# Patient Record
Sex: Male | Born: 1985 | Race: Black or African American | Hispanic: No | Marital: Married | Smoking: Never smoker
Health system: Southern US, Community
[De-identification: ages and names within clinical notes are randomized; demographics above are authoritative.]

## PROBLEM LIST (undated history)

## (undated) DIAGNOSIS — H33311 Horseshoe tear of retina without detachment, right eye: Secondary | ICD-10-CM

## (undated) DIAGNOSIS — G4730 Sleep apnea, unspecified: Secondary | ICD-10-CM

## (undated) DIAGNOSIS — J40 Bronchitis, not specified as acute or chronic: Secondary | ICD-10-CM

## (undated) HISTORY — PX: ADENOIDECTOMY: SUR15

---
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
## 2007-10-24 ENCOUNTER — Emergency Department (HOSPITAL_COMMUNITY): Admission: EM | Admit: 2007-10-24 | Discharge: 2007-10-24 | Payer: Self-pay | Admitting: Emergency Medicine

## 2008-01-26 ENCOUNTER — Emergency Department (HOSPITAL_COMMUNITY): Admission: EM | Admit: 2008-01-26 | Discharge: 2008-01-26 | Payer: Self-pay | Admitting: Emergency Medicine

## 2008-12-16 ENCOUNTER — Emergency Department (HOSPITAL_COMMUNITY): Admission: EM | Admit: 2008-12-16 | Discharge: 2008-12-16 | Payer: Self-pay | Admitting: Emergency Medicine

## 2010-03-23 ENCOUNTER — Encounter (INDEPENDENT_AMBULATORY_CARE_PROVIDER_SITE_OTHER): Payer: Self-pay | Admitting: *Deleted

## 2010-03-23 ENCOUNTER — Emergency Department (HOSPITAL_COMMUNITY): Admission: EM | Admit: 2010-03-23 | Discharge: 2010-03-23 | Payer: Self-pay | Admitting: Family Medicine

## 2010-04-07 ENCOUNTER — Telehealth: Payer: Self-pay | Admitting: Internal Medicine

## 2010-07-06 NOTE — Progress Notes (Signed)
Summary: nos appt  Phone Note Call from Patient   Caller: [email or web address]  Call For: wert Summary of Call: LMTCB x2 to rsc nos from 11/1. Initial call taken by: Darletta Moll,  April 07, 2010 2:59 PM

## 2010-07-06 NOTE — Letter (Signed)
Summary: New Patient Letter  [facility] [place]  [address] [place], [place] [address]   Phone: [phone]  Fax: [phone]       03/23/2010 MRN: [id number]  Oscar Morris [address] [place], [place]  [address]  Dear Mr. Oscar Morris,  Welcome to [facility] and thank you for choosing [place] as your Primary Care Providers. Enclosed you will find information about our practice that we hope you find helpful. We have also enclosed forms to be filled out prior to your visit. This will provide [place] with the necessary information and facilitate your being seen in a timely manner. If you have any questions, please call us at: [phone] and we will be happy to assist you. We look forward to seeing you at your scheduled appointment time.  Appointment Friday, April 23, 2010 at 3:30pm  with Dr. Marga Melnick   Sincerely,  Primary Health Care Team  Please arrive 15 minutes early for your first appointment and bring your insurance card. Co-pay is required at the time of your visit.  *****Please call the office if you are not able to keep this appointment. There is a charge of $50.00 if any appointment is not cancelled or rescheduled within 24 hours*****

## 2010-10-22 NOTE — Op Note (Signed)
   NAME:  Oscar Morris, Oscar Morris                        ACCOUNT NO.:  [id number]   MEDICAL RECORD NO.:  [id number]                   PATIENT TYPE:  AMB   LOCATION:  [facility]                                  FACILITY:  [facility]   PHYSICIAN:  Kristine Garbe. Ezzard Standing, M.D.         DATE OF BIRTH:  July 01, 1985   DATE OF PROCEDURE:  06/07/2002  DATE OF DISCHARGE:                                 OPERATIVE REPORT   PREOPERATIVE DIAGNOSIS:  Adenoid hypertrophy with obstructive breathing  pattern.   POSTOPERATIVE DIAGNOSIS:  Adenoid hypertrophy with obstructive breathing  pattern.   OPERATION PERFORMED:  Adenoidectomy.   SURGEON:  Kristine Garbe. Ezzard Standing, M.D.   ANESTHESIA:  General endotracheal.   COMPLICATIONS:  None.   INDICATIONS FOR PROCEDURE:  The patient is a 25 year old who has chronic  nasal obstruction, breathes heavily at night with heavy snoring.  On  examination, he has large adenoid tissue with moderate sized 2+ tonsils.  He  is taken to the operating room at this time for adenoidectomy.   DESCRIPTION OF PROCEDURE:  After adequate endotracheal anesthesia, a mouth  gag was used to expose the oropharynx.  A red rubber catheter was passed  through the nose and out the mouth to retract the soft palate and the  nasopharynx was examined.  The patient had large obstructing adenoid tissue,  a large adenoid curet was used to remove the central pad of adenoid tissue,  nasopharyngeal packs were placed for hemostasis.  These were then removed  and further hemostasis was obtained with suction cautery.  After obtaining  adequate hemostasis, the nose and nasopharynx was irrigated with saline.  This completed the procedure.  The patient was awakened from anesthesia and  transferred to recovery postoperatively doing well.   DISPOSITION:  The patient is discharge to home later this morning on Tylenol  for pain and amoxicillin 500 mg b.i.d. for four days.      Kristine Garbe. Ezzard Standing, M.D.    CEN/MEDQ  D:  06/07/2002  T:  06/07/2002  Job:  [id number]

## 2011-06-09 ENCOUNTER — Encounter (HOSPITAL_COMMUNITY): Payer: Self-pay | Admitting: Emergency Medicine

## 2011-06-09 ENCOUNTER — Encounter: Payer: Self-pay | Admitting: Emergency Medicine

## 2011-06-09 ENCOUNTER — Emergency Department (HOSPITAL_COMMUNITY)
Admission: EM | Admit: 2011-06-09 | Discharge: 2011-06-10 | Disposition: A | Discharge status: Home / Self Care | Payer: BC Managed Care – PPO | Attending: Emergency Medicine | Admitting: Emergency Medicine

## 2011-06-09 ENCOUNTER — Emergency Department (HOSPITAL_COMMUNITY)
Admission: EM | Admit: 2011-06-09 | Discharge: 2011-06-09 | Disposition: A | Discharge status: Home / Self Care | Payer: BC Managed Care – PPO | Attending: Emergency Medicine | Admitting: Emergency Medicine

## 2011-06-09 ENCOUNTER — Emergency Department (HOSPITAL_COMMUNITY): Payer: BC Managed Care – PPO

## 2011-06-09 DIAGNOSIS — R0602 Shortness of breath: Secondary | ICD-10-CM | POA: Insufficient documentation

## 2011-06-09 DIAGNOSIS — F411 Generalized anxiety disorder: Secondary | ICD-10-CM | POA: Insufficient documentation

## 2011-06-09 DIAGNOSIS — J40 Bronchitis, not specified as acute or chronic: Secondary | ICD-10-CM | POA: Insufficient documentation

## 2011-06-09 DIAGNOSIS — R05 Cough: Secondary | ICD-10-CM | POA: Insufficient documentation

## 2011-06-09 DIAGNOSIS — R059 Cough, unspecified: Secondary | ICD-10-CM | POA: Insufficient documentation

## 2011-06-09 DIAGNOSIS — J45909 Unspecified asthma, uncomplicated: Secondary | ICD-10-CM | POA: Insufficient documentation

## 2011-06-09 DIAGNOSIS — Z79899 Other long term (current) drug therapy: Secondary | ICD-10-CM | POA: Insufficient documentation

## 2011-06-09 DIAGNOSIS — R079 Chest pain, unspecified: Secondary | ICD-10-CM | POA: Insufficient documentation

## 2011-06-09 DIAGNOSIS — R1110 Vomiting, unspecified: Secondary | ICD-10-CM | POA: Insufficient documentation

## 2011-06-09 DIAGNOSIS — IMO0001 Reserved for inherently not codable concepts without codable children: Secondary | ICD-10-CM | POA: Insufficient documentation

## 2011-06-09 HISTORY — DX: Bronchitis, not specified as acute or chronic: J40

## 2011-06-09 MED ORDER — PROMETHAZINE HCL 25 MG PO TABS: 25.0000 mg | ORAL_TABLET | Freq: Four times a day (QID) | ORAL | Status: AC | PRN

## 2011-06-09 MED ORDER — ALBUTEROL SULFATE HFA 108 (90 BASE) MCG/ACT IN AERS
2.0000 | INHALATION_SPRAY | Freq: Four times a day (QID) | RESPIRATORY_TRACT | Status: DC
  Administered 2011-06-09: 2 via RESPIRATORY_TRACT
  Filled 2011-06-09: qty 6.7

## 2011-06-09 NOTE — ED Notes (Signed)
Pt alert, nad, c/o cough, URI, onset several days ago, resp even unlabored, skin pwd, recent ill exposure to spouse, dry npc noted

## 2011-06-09 NOTE — ED Notes (Signed)
Patient transported to X-ray 

## 2011-06-09 NOTE — ED Notes (Signed)
PT. SEEN AT [facility] THIS MORNING DIAGNOSED WITH BRONCHITIS , GIVEN MDI FOR SOB , REPORTS PERSISTENT SOB WITH PRODUCTIVE COUGH.

## 2011-06-09 NOTE — ED Provider Notes (Addendum)
History     CSN: [id number]  Arrival date & time 06/09/11  8295   First MD Initiated Contact with Patient 06/09/11 0715      Chief Complaint  Patient presents with  . Cough  . URI    (Consider location/radiation/quality/duration/timing/severity/associated sxs/prior treatment) Patient is a 26 y.o. male presenting with cough and URI. The history is provided by the patient and the spouse.  Cough This is a new problem. The current episode started more than 1 week ago. The problem occurs constantly. The problem has not changed since onset.The cough is non-productive. There has been no fever. Associated symptoms include myalgias and shortness of breath. Pertinent negatives include no chest pain, no chills, no ear congestion, no headaches, no sore throat, no wheezing and no eye redness. He is not a smoker. His past medical history is significant for asthma. His past medical history does not include pneumonia.  URI The primary symptoms include fever, cough, vomiting and myalgias. Primary symptoms do not include headaches, sore throat, wheezing, abdominal pain, nausea or rash.  Symptoms associated with the illness include congestion. The illness is not associated with chills.   Patient now with 2 week history of upper respiratory infection cough persistent fever chills on and off occasional vomiting. Not improved. Past history of asthma.  Past Medical History  Diagnosis Date  . Asthma     History reviewed. No pertinent past surgical history.  No family history on file.  History  Substance Use Topics  . Smoking status: Not on file  . Smokeless tobacco: Not on file  . Alcohol Use: No      Review of Systems  Constitutional: Positive for fever. Negative for chills.  HENT: Positive for congestion. Negative for sore throat, neck pain and neck stiffness.   Eyes: Negative for redness.  Respiratory: Positive for cough and shortness of breath. Negative for wheezing.   Cardiovascular:  Negative for chest pain.  Gastrointestinal: Positive for vomiting. Negative for nausea, abdominal pain and diarrhea.  Genitourinary: Negative for dysuria.  Musculoskeletal: Positive for myalgias. Negative for back pain.  Skin: Negative for rash.  Neurological: Negative for headaches.  Hematological: Negative for adenopathy.    Allergies  Review of patient's allergies indicates no known allergies.  Home Medications   Current Outpatient Rx  Name Route Sig Dispense Refill  . PROMETHAZINE HCL 25 MG PO TABS Oral Take 1 tablet (25 mg total) by mouth every 6 (six) hours as needed for nausea. 12 tablet 0    BP 129/69  Pulse 90  Temp(Src) 98 F (36.7 C) (Oral)  Resp 16  Wt 195 lb (88.451 kg)  SpO2 97%  Physical Exam  Nursing note and vitals reviewed. Constitutional: He is oriented to person, place, and time. He appears well-developed and well-nourished.  HENT:  Head: Normocephalic and atraumatic.  Mouth/Throat: Oropharynx is clear and moist.  Eyes: Conjunctivae and EOM are normal. Pupils are equal, round, and reactive to light.  Neck: Normal range of motion. Neck supple.  Cardiovascular: Normal rate, regular rhythm and normal heart sounds.   No murmur heard. Pulmonary/Chest: Effort normal. He has wheezes.  Abdominal: Soft. Bowel sounds are normal. There is no tenderness.  Musculoskeletal: Normal range of motion. He exhibits no edema and no tenderness.  Lymphadenopathy:    He has no cervical adenopathy.  Neurological: He is alert and oriented to person, place, and time. No cranial nerve deficit. He exhibits normal muscle tone. Coordination normal.  Skin: Skin is warm and dry. No  rash noted. No erythema.    ED Course  Procedures (including critical care time)  Labs Reviewed - No data to display Dg Chest 2 View  06/09/2011  *RADIOLOGY REPORT*  Clinical Data: Cough and short of breath  CHEST - 2 VIEW  Comparison: 03/23/2010  Findings: Slightly shallow inspiration. The heart size  and pulmonary vascularity are normal. The lungs appear clear and expanded without focal air space disease or consolidation. No blunting of the costophrenic angles.  No pneumothorax.  IMPRESSION: No evidence of active pulmonary disease.  Original Report Authenticated By: Marlon Pel, M.D.     1. Bronchitis       MDM   Symptoms consistent with persistent upper respiratory infection now with bronchitis-type symptoms. Chest x-ray negative for pneumonia. Patient with some mild left-sided wheezing on examination will treat with albuterol inhaler first dose given in the emergency department, patient lives inhaler to take home to continue a course for the next 7 days. The vomiting the related to call if either way patient will be treated with Phenergan for that.          Shelda Jakes, MD 06/09/11 4098  Shelda Jakes, MD 06/09/11 [phone]

## 2011-06-10 ENCOUNTER — Other Ambulatory Visit: Payer: Self-pay

## 2011-06-10 ENCOUNTER — Emergency Department (HOSPITAL_COMMUNITY): Payer: BC Managed Care – PPO

## 2011-06-10 LAB — POCT I-STAT, CHEM 8
BUN: 10 mg/dL (ref 6–23)
Calcium, Ion: 1.17 mmol/L (ref 1.12–1.32)
Creatinine, Ser: 0.9 mg/dL (ref 0.50–1.35)
Sodium: 139 mEq/L (ref 135–145)
TCO2: 29 mmol/L (ref 0–100)

## 2011-06-10 LAB — POCT I-STAT TROPONIN I

## 2011-06-10 LAB — CBC
Hemoglobin: 13.8 g/dL (ref 13.0–17.0)
MCH: 31.4 pg (ref 26.0–34.0)
MCV: 90.5 fL (ref 78.0–100.0)
RBC: 4.4 MIL/uL (ref 4.22–5.81)

## 2011-06-10 MED ORDER — METHYLPREDNISOLONE SODIUM SUCC 125 MG IJ SOLR
125.0000 mg | Freq: Once | INTRAMUSCULAR | Status: AC
  Administered 2011-06-10: 125 mg via INTRAVENOUS
  Filled 2011-06-10: qty 2

## 2011-06-10 MED ORDER — PREDNISONE 50 MG PO TABS: 50.0000 mg | ORAL_TABLET | Freq: Every day | ORAL | Status: DC

## 2011-06-10 MED ORDER — DOXYCYCLINE HYCLATE 100 MG PO TABS: 100.0000 mg | ORAL_TABLET | Freq: Two times a day (BID) | ORAL | Status: AC

## 2011-06-10 MED ORDER — IPRATROPIUM BROMIDE 0.02 % IN SOLN
0.5000 mg | Freq: Once | RESPIRATORY_TRACT | Status: AC
  Administered 2011-06-10: 0.5 mg via RESPIRATORY_TRACT
  Filled 2011-06-10: qty 2.5

## 2011-06-10 MED ORDER — KETOROLAC TROMETHAMINE 30 MG/ML IJ SOLN
30.0000 mg | Freq: Once | INTRAMUSCULAR | Status: AC
  Administered 2011-06-10: 30 mg via INTRAVENOUS
  Filled 2011-06-10: qty 1

## 2011-06-10 MED ORDER — ALBUTEROL SULFATE (5 MG/ML) 0.5% IN NEBU
2.5000 mg | INHALATION_SOLUTION | Freq: Once | RESPIRATORY_TRACT | Status: AC
  Administered 2011-06-10: 2.5 mg via RESPIRATORY_TRACT
  Filled 2011-06-10: qty 0.5

## 2011-06-10 NOTE — ED Provider Notes (Signed)
History     CSN: [id number]  Arrival date & time 06/09/11  2216   First MD Initiated Contact with Patient 06/10/11 [phone]      Chief Complaint  Patient presents with  . Bronchitis    (Consider location/radiation/quality/duration/timing/severity/associated sxs/prior treatment) Patient is a 26 y.o. male presenting with cough and wheezing. The history is provided by the patient. A language interpreter was used.  Cough This is a recurrent problem. The current episode started more than 1 week ago. The problem occurs constantly. The problem has not changed since onset.The cough is productive of sputum. Associated symptoms include chest pain and wheezing. Pertinent negatives include no chills, no sweats, no weight loss, no ear congestion, no ear pain, no headaches, no rhinorrhea, no sore throat, no myalgias and no eye redness. Treatments tried: albuterol MDI. The treatment provided mild relief. Risk factors: none. He is not a smoker.  Wheezing  The current episode started 2 days ago. The onset was gradual. The problem occurs continuously. The problem has been unchanged. The problem is moderate. The symptoms are relieved by nothing. The symptoms are aggravated by nothing. Associated symptoms include chest pain, cough and wheezing. Pertinent negatives include no chest pressure, no orthopnea, no rhinorrhea, no sore throat and no stridor. There was no intake of a foreign body. He has not inhaled smoke recently. He has had no prior steroid use. He has had no prior hospitalizations. He has been behaving normally. Urine output has been normal. The last void occurred less than 6 hours ago. Recently, medical care has been given at this facility. Services received include tests performed and medications given.  Patient with pain in chest with cough.  No long car trips or plane trips.  No swelling or pain in the lower extremities.  PERC negative.  No DOE.   States the albuterol MDi given at [facility] is not helping his  symptoms  Past Medical History  Diagnosis Date  . Asthma   . Bronchitis     History reviewed. No pertinent past surgical history.  No family history on file.  History  Substance Use Topics  . Smoking status: Not on file  . Smokeless tobacco: Not on file  . Alcohol Use: No      Review of Systems  Constitutional: Negative for chills, weight loss and unexpected weight change.  HENT: Positive for congestion. Negative for ear pain, sore throat and rhinorrhea.   Eyes: Negative for redness.  Respiratory: Positive for cough and wheezing. Negative for stridor.   Cardiovascular: Positive for chest pain. Negative for orthopnea.  Gastrointestinal: Negative for abdominal distention.  Genitourinary: Negative for difficulty urinating.  Musculoskeletal: Negative for myalgias.  Skin: Negative.   Neurological: Negative for headaches.  Hematological: Negative.   Psychiatric/Behavioral: Negative.     Allergies  Review of patient's allergies indicates no known allergies.  Home Medications   Current Outpatient Rx  Name Route Sig Dispense Refill  . PROMETHAZINE HCL 25 MG PO TABS Oral Take 1 tablet (25 mg total) by mouth every 6 (six) hours as needed for nausea. 12 tablet 0    BP 127/76  Pulse 65  Temp(Src) 98.5 F (36.9 C) (Oral)  Resp 18  SpO2 96%  Physical Exam  Constitutional: He is oriented to person, place, and time. He appears well-developed and well-nourished. No distress.  HENT:  Head: Normocephalic and atraumatic.  Mouth/Throat: Oropharynx is clear and moist. No oropharyngeal exudate.  Eyes: EOM are normal. Pupils are equal, round, and reactive to light.  Neck: Normal range of motion. Neck supple.  Cardiovascular: Normal rate and regular rhythm.   Pulmonary/Chest: No stridor. He has wheezes. He exhibits tenderness.  Abdominal: Soft. Bowel sounds are normal.  Musculoskeletal: Normal range of motion.  Neurological: He is alert and oriented to person, place, and time.    Skin: Skin is warm and dry. He is not diaphoretic.  Psychiatric: Thought content normal. His mood appears anxious.    ED Course  Procedures (including critical care time)  Labs Reviewed  POCT I-STAT, CHEM 8 - Abnormal; Notable for the following:    Glucose, Bld 105 (*)    All other components within normal limits  CBC  POCT I-STAT TROPONIN I  I-STAT TROPONIN I  I-STAT, CHEM 8   Dg Chest 2 View  06/10/2011  *RADIOLOGY REPORT*  Clinical Data: Shortness of breath and cough.  Previously seen at [facility] for bronchitis.  CHEST - 2 VIEW  Comparison: 06/09/2011 at [facility]  Findings: The heart size and pulmonary vascularity are normal. The lungs appear clear and expanded without focal air space disease or consolidation. No blunting of the costophrenic angles.  No pneumothorax.  No significant change since previous study.  IMPRESSION: No evidence of active pulmonary disease.  No significant interval change.  Original Report Authenticated By: Marlon Pel, M.D.   Dg Chest 2 View  06/09/2011  *RADIOLOGY REPORT*  Clinical Data: Cough and short of breath  CHEST - 2 VIEW  Comparison: 03/23/2010  Findings: Slightly shallow inspiration. The heart size and pulmonary vascularity are normal. The lungs appear clear and expanded without focal air space disease or consolidation. No blunting of the costophrenic angles.  No pneumothorax.  IMPRESSION: No evidence of active pulmonary disease.  Original Report Authenticated By: Marlon Pel, M.D.     No diagnosis found.    MDM   Date: 06/10/2011  Rate: 66  Rhythm: normal sinus rhythm  QRS Axis: normal  Intervals: normal  ST/T Wave abnormalities: normal  Conduction Disutrbances:none  Narrative Interpretation:   Old EKG Reviewed: none available    Patient reassured by negative tests will give RX for steroids and doxycycline and patient to follow up with PMD.  Return for chest pain shortness of breath inability to tolerate  meds or any concerns.       Jasmine Awe, MD 06/10/11 [phone]

## 2011-06-10 NOTE — ED Notes (Signed)
EKG completed. No OLD ekg. Done by NT Piedad Climes.

## 2013-06-21 ENCOUNTER — Ambulatory Visit: Payer: 59

## 2013-06-21 ENCOUNTER — Ambulatory Visit (INDEPENDENT_AMBULATORY_CARE_PROVIDER_SITE_OTHER): Payer: 59 | Admitting: Family Medicine

## 2013-06-21 VITALS — BP 114/68 | HR 97 | Temp 99.0°F | Resp 17 | Ht 70.0 in | Wt 207.0 lb

## 2013-06-21 DIAGNOSIS — R059 Cough, unspecified: Secondary | ICD-10-CM

## 2013-06-21 DIAGNOSIS — J209 Acute bronchitis, unspecified: Secondary | ICD-10-CM

## 2013-06-21 DIAGNOSIS — R0981 Nasal congestion: Secondary | ICD-10-CM

## 2013-06-21 DIAGNOSIS — J3489 Other specified disorders of nose and nasal sinuses: Secondary | ICD-10-CM

## 2013-06-21 DIAGNOSIS — R05 Cough: Secondary | ICD-10-CM

## 2013-06-21 LAB — POCT CBC
Granulocyte percent: 63.4 %G (ref 37–80)
HCT, POC: 47.5 % (ref 43.5–53.7)
Hemoglobin: 15 g/dL (ref 14.1–18.1)
Lymph, poc: 2.7 (ref 0.6–3.4)
MCH, POC: 30.9 pg (ref 27–31.2)
MCHC: 31.6 g/dL — AB (ref 31.8–35.4)
MCV: 97.8 fL — AB (ref 80–97)
MID (cbc): 0.6 (ref 0–0.9)
MPV: 9.2 fL (ref 0–99.8)
POC Granulocyte: 5.8 (ref 2–6.9)
POC LYMPH PERCENT: 29.6 %L (ref 10–50)
POC MID %: 7 %M (ref 0–12)
Platelet Count, POC: 284 10*3/uL (ref 142–424)
RBC: 4.86 M/uL (ref 4.69–6.13)
RDW, POC: 13.7 %
WBC: 9.1 10*3/uL (ref 4.6–10.2)

## 2013-06-21 MED ORDER — HYDROCOD POLST-CHLORPHEN POLST 10-8 MG/5ML PO LQCR: 5.0000 mL | Freq: Two times a day (BID) | ORAL | Status: DC | PRN

## 2013-06-21 MED ORDER — AZITHROMYCIN 250 MG PO TABS: ORAL_TABLET | ORAL | Status: DC

## 2013-06-21 MED ORDER — GUAIFENESIN ER 1200 MG PO TB12: 1.0000 | ORAL_TABLET | Freq: Two times a day (BID) | ORAL | Status: DC | PRN

## 2013-06-21 NOTE — Progress Notes (Signed)
Subjective:    Patient ID: Oscar Morris, male    DOB: 1986/04/09, 28 y.o.   MRN: [id number]  HPI 28 year old male presents for evaluation of cough x 1 week.  States symptoms started a few days after his children were diagnosed with RSV.  Admits he has cough, nasal congestion, PND, sore throat, chills, some chest pain while coughing, and SOB.  Denies fever, headache, dizziness, nausea, or vomiting.  States his children were seen at the ER last week and were not tested for flu.  His symptoms have been present for 1 week.  Admits he had 1 episode of possible hemoptysis at onset but has since had dry, hacking cough.  He has taken OTC Robitussin which has helped his cough slightly.   Patient is otherwise healthy with no other concerns today.     Review of Systems  Constitutional: Positive for chills and fatigue. Negative for fever.  HENT: Positive for congestion, postnasal drip, rhinorrhea, sinus pressure and sore throat. Negative for ear pain and trouble swallowing.   Respiratory: Positive for cough and shortness of breath. Negative for chest tightness and wheezing.   Cardiovascular: Positive for chest pain (while coughing).  Gastrointestinal: Negative for nausea and vomiting.  Neurological: Negative for dizziness.       Objective:   Physical Exam  Constitutional: He is oriented to person, place, and time. He appears well-developed and well-nourished.  HENT:  Head: Normocephalic and atraumatic.  Right Ear: Hearing, tympanic membrane, external ear and ear canal normal.  Left Ear: Hearing, tympanic membrane, external ear and ear canal normal.  Mouth/Throat: Uvula is midline, oropharynx is clear and moist and mucous membranes are normal.  Eyes: Conjunctivae are normal.  Neck: Normal range of motion. Neck supple.  Cardiovascular: Normal rate, regular rhythm and normal heart sounds.   Pulmonary/Chest: Effort normal and breath sounds normal.  Lymphadenopathy:    He has no cervical  adenopathy.  Neurological: He is alert and oriented to person, place, and time.  Psychiatric: He has a normal mood and affect. His behavior is normal. Judgment and thought content normal.   Results for orders placed in visit on 06/21/13  POCT CBC      Result Value Range   WBC 9.1  4.6 - 10.2 K/uL   Lymph, poc 2.7  0.6 - 3.4   POC LYMPH PERCENT 29.6  10 - 50 %L   MID (cbc) 0.6  0 - 0.9   POC MID % 7.0  0 - 12 %M   POC Granulocyte 5.8  2 - 6.9   Granulocyte percent 63.4  37 - 80 %G   RBC 4.86  4.69 - 6.13 M/uL   Hemoglobin 15.0  14.1 - 18.1 g/dL   HCT, POC 40.9  81.1 - 53.7 %   MCV 97.8 (*) 80 - 97 fL   MCH, POC 30.9  27 - 31.2 pg   MCHC 31.6 (*) 31.8 - 35.4 g/dL   RDW, POC 91.4     Platelet Count, POC 284  142 - 424 K/uL   MPV 9.2  0 - 99.8 fL      [facility] reading (PRIMARY) by  Dr. Patsy Lager as no acute consolidation or infiltrate.      Assessment & Plan:  Acute bronchitis - Plan: azithromycin (ZITHROMAX) 250 MG tablet  Cough - Plan: DG Chest 2 View, POCT CBC, chlorpheniramine-HYDROcodone (TUSSIONEX PENNKINETIC ER) 10-8 MG/5ML LQCR, Guaifenesin (MUCINEX MAXIMUM STRENGTH) 1200 MG TB12  Nasal congestion  CBC normal  and CXR negative which is reassuring Due to length of illness, will go ahead and cover him with Zpack as directed Tussionex qhs prn cough.  Out of work x 2 days to rest Increase fluids Recommend Mucinex OTC as directed to help with congestion.   Follow up if symptoms worsen or fail to improve.

## 2016-04-17 ENCOUNTER — Encounter (HOSPITAL_COMMUNITY): Payer: Self-pay

## 2016-04-17 ENCOUNTER — Emergency Department (HOSPITAL_COMMUNITY)
Admission: EM | Admit: 2016-04-17 | Discharge: 2016-04-18 | Disposition: A | Discharge status: Home / Self Care | Payer: Self-pay | Attending: Emergency Medicine | Admitting: Emergency Medicine

## 2016-04-17 DIAGNOSIS — J45909 Unspecified asthma, uncomplicated: Secondary | ICD-10-CM | POA: Insufficient documentation

## 2016-04-17 DIAGNOSIS — H1031 Unspecified acute conjunctivitis, right eye: Secondary | ICD-10-CM

## 2016-04-17 MED ORDER — TETRACAINE HCL 0.5 % OP SOLN
2.0000 [drp] | Freq: Once | OPHTHALMIC | Status: AC
  Administered 2016-04-17: 2 [drp] via OPHTHALMIC
  Filled 2016-04-17: qty 4

## 2016-04-17 MED ORDER — FLUORESCEIN SODIUM 1 MG OP STRP
1.0000 | ORAL_STRIP | Freq: Once | OPHTHALMIC | Status: AC
  Administered 2016-04-17: 1 via OPHTHALMIC
  Filled 2016-04-17: qty 1

## 2016-04-17 MED ORDER — POLYMYXIN B-TRIMETHOPRIM 10000-0.1 UNIT/ML-% OP SOLN: 1.0000 [drp] | OPHTHALMIC | 0 refills | Status: DC

## 2016-04-17 NOTE — ED Triage Notes (Signed)
PT C/O PAIN AND REDNESS TO BOTH EYES SINCE 5 PM TODAY. PT STS HE ONLY FELT PAIN WHEN BLINKING TO THE RIGHT EYE, BUT HIS FAMILY WANTED HIM TO COME FOR POSSIBLE PINK EYE. DENIES DRAINAGE.

## 2016-04-17 NOTE — ED Provider Notes (Signed)
[facility]-EMERGENCY DEPT Provider Note   CSN: [id number] Arrival date & time: 04/17/16  2243     History   Chief Complaint Chief Complaint  Patient presents with  . Conjunctivitis    HPI Oscar Morris is a 30 y.o. male with no significant past mental history who presents to the ED today complaining of irritation to his right eye. Pt states that around 5 pm he had a sensation as if something was stuck in his eye so he began rubbing he and then removed his contact lens. Pt states his eye has become more red and irritated. Patient states that the eyelids are very itchy. He states that he used some of his wife's prescription eyedrops without relief of his symptoms. He denies any pain with movement of his eye, visual change. He states his contacts or other reusable ones and are not out of date. He does have an eye doctor.  HPI  Past Medical History:  Diagnosis Date  . Asthma   . Bronchitis     There are no active problems to display for this patient.   Past Surgical History:  Procedure Laterality Date  . ADENOIDECTOMY         Home Medications    Prior to Admission medications   Medication Sig Start Date End Date Taking? Authorizing Provider  azithromycin (ZITHROMAX) 250 MG tablet Take 2 tabs PO x 1 dose, then 1 tab PO QD x 4 days 06/21/13   Nelva NayHeather M Marte, PA-C  chlorpheniramine-HYDROcodone (TUSSIONEX PENNKINETIC ER) 10-8 MG/5ML LQCR Take 5 mLs by mouth every 12 (twelve) hours as needed for cough (cough). 06/21/13   Heather Jaquita RectorM Marte, PA-C  Guaifenesin (MUCINEX MAXIMUM STRENGTH) 1200 MG TB12 Take 1 tablet (1,200 mg total) by mouth every 12 (twelve) hours as needed. 06/21/13   Nelva NayHeather M Marte, PA-C    Family History History reviewed. No pertinent family history.  Social History Social History  Substance Use Topics  . Smoking status: Never Smoker  . Smokeless tobacco: Never Used  . Alcohol use No     Allergies   Patient has no known allergies.   Review of  Systems Review of Systems  All other systems reviewed and are negative.    Physical Exam Updated Vital Signs BP 138/87 (BP Location: Right Arm)   Pulse 65   Temp 98.2 F (36.8 C) (Oral)   Resp 18   Ht 5\' 10"  (1.778 m)   Wt 106.6 kg   SpO2 95%   BMI 33.72 kg/m   Physical Exam  Constitutional: He is oriented to person, place, and time. He appears well-developed and well-nourished. No distress.  HENT:  Head: Normocephalic and atraumatic.  Eyes: Lids are normal. Pupils are equal, round, and reactive to light. Lids are everted and swept, no foreign bodies found. Right eye exhibits no discharge and no exudate. No foreign body present in the right eye. Left eye exhibits no discharge. Right conjunctiva is injected. No scleral icterus.  Slit lamp exam:      The right eye shows no corneal abrasion, no foreign body, no fluorescein uptake and no anterior chamber bulge.       The left eye shows no corneal abrasion, no fluorescein uptake and no anterior chamber bulge.  Cardiovascular: Normal rate.   Pulmonary/Chest: Effort normal.  Neurological: He is alert and oriented to person, place, and time. Coordination normal.  Skin: Skin is warm and dry. No rash noted. He is not diaphoretic. No erythema. No pallor.  Psychiatric:  He has a normal mood and affect. His behavior is normal.  Nursing note and vitals reviewed.    ED Treatments / Results  Labs (all labs ordered are listed, but only abnormal results are displayed) Labs Reviewed - No data to display  EKG  EKG Interpretation None       Radiology No results found.  Procedures Procedures (including critical care time)  Medications Ordered in ED Medications  tetracaine (PONTOCAINE) 0.5 % ophthalmic solution 2 drop (not administered)  fluorescein ophthalmic strip 1 strip (not administered)     Initial Impression / Assessment and Plan / ED Course  I have reviewed the triage vital signs and the nursing notes.  Pertinent labs  & imaging results that were available during my care of the patient were reviewed by me and considered in my medical decision making (see chart for details).  Clinical Course     Patient presentation consistent with conjunctivitis.  No purulent discharge, corneal abrasions, entrapment, consensual photophobia, or dendritic staining with fluorescein study.  Presentation non-concerning for iritis,, corneal abrasions, or HSV. Pt is a contact lens wearer, will give antibiotic eye drops and have encouraged pt to not use contacts until symptoms have resolved. Personal hygiene and frequent handwashing discussed.  Patient advised to followup with ophthalmologist if symptoms persist or worsen in any way including vision change or purulent discharge.  Patient verbalizes understanding and is agreeable with discharge.   Final Clinical Impressions(s) / ED Diagnoses   Final diagnoses:  Acute conjunctivitis of right eye, unspecified acute conjunctivitis type    New Prescriptions New Prescriptions   No medications on file     Dub MikesSamantha Tripp Vola Beneke, PA-C 04/20/16 1343    Lorre NickAnthony Allen, MD 04/21/16 [phone]

## 2016-04-17 NOTE — Discharge Instructions (Signed)
Use eye drops as prescribed. Avoid use of contacts until symptoms resolve. Keep hands very clean. Follow up with eye doctor if symptoms do not improve.

## 2016-04-17 NOTE — ED Notes (Signed)
Pt without corrective lens for visual acuity.

## 2016-04-17 NOTE — ED Notes (Signed)
V.O. Received Samantha, PA-C to write note to be out of work x 2 days

## 2016-04-17 NOTE — ED Notes (Signed)
Pt stated "I don't have anymore contacts and I don't have glasses.  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  Informed will speak to PA-C.

## 2018-03-06 ENCOUNTER — Emergency Department (HOSPITAL_COMMUNITY): Payer: 59

## 2018-03-06 ENCOUNTER — Other Ambulatory Visit: Payer: Self-pay

## 2018-03-06 ENCOUNTER — Emergency Department (HOSPITAL_COMMUNITY)
Admission: EM | Admit: 2018-03-06 | Discharge: 2018-03-06 | Disposition: A | Discharge status: Home / Self Care | Payer: 59 | Attending: Emergency Medicine | Admitting: Emergency Medicine

## 2018-03-06 ENCOUNTER — Encounter (HOSPITAL_COMMUNITY): Payer: Self-pay | Admitting: Emergency Medicine

## 2018-03-06 DIAGNOSIS — R0789 Other chest pain: Secondary | ICD-10-CM | POA: Insufficient documentation

## 2018-03-06 DIAGNOSIS — R0602 Shortness of breath: Secondary | ICD-10-CM | POA: Diagnosis present

## 2018-03-06 DIAGNOSIS — J45909 Unspecified asthma, uncomplicated: Secondary | ICD-10-CM | POA: Insufficient documentation

## 2018-03-06 DIAGNOSIS — J069 Acute upper respiratory infection, unspecified: Secondary | ICD-10-CM

## 2018-03-06 DIAGNOSIS — Z79899 Other long term (current) drug therapy: Secondary | ICD-10-CM | POA: Insufficient documentation

## 2018-03-06 HISTORY — DX: Sleep apnea, unspecified: G47.30

## 2018-03-06 HISTORY — DX: Horseshoe tear of retina without detachment, right eye: H33.311

## 2018-03-06 MED ORDER — ALBUTEROL SULFATE (2.5 MG/3ML) 0.083% IN NEBU
5.0000 mg | INHALATION_SOLUTION | Freq: Once | RESPIRATORY_TRACT | Status: AC
  Administered 2018-03-06: 5 mg via RESPIRATORY_TRACT
  Filled 2018-03-06: qty 6

## 2018-03-06 MED ORDER — FLUTICASONE PROPIONATE 50 MCG/ACT NA SUSP: 2.0000 | Freq: Every day | NASAL | 0 refills | Status: DC

## 2018-03-06 MED ORDER — CETIRIZINE HCL 10 MG PO TABS: 10.0000 mg | ORAL_TABLET | Freq: Every day | ORAL | 0 refills | Status: DC

## 2018-03-06 MED ORDER — ALBUTEROL SULFATE HFA 108 (90 BASE) MCG/ACT IN AERS
1.0000 | INHALATION_SPRAY | Freq: Once | RESPIRATORY_TRACT | Status: AC
  Administered 2018-03-06: 1 via RESPIRATORY_TRACT
  Filled 2018-03-06: qty 6.7

## 2018-03-06 NOTE — ED Triage Notes (Signed)
Pt reports SHOB starting last night. Pt also reports intermittent  L CP radiating to L side starting last night. Pt Pt reports sore throat and nasal congestion. Pt reports hx of asthma as a child.

## 2018-03-06 NOTE — ED Provider Notes (Signed)
[facility] EMERGENCY DEPARTMENT Provider Note   CSN: [id number] Arrival date & time: 03/06/18  1827     History   Chief Complaint Chief Complaint  Patient presents with  . Shortness of Breath  . Chest Pain    HPI Oscar Morris is a 32 y.o. male with history of asthma and sleep apnea presents today for evaluation of acute onset, progressively worsening URI symptoms for 1 week.  He notes nasal congestion, sore throat, cough intermittently productive of clear sputum.  2 nights ago he began to feel some shortness of breath intermittently.  Did not appear to be exertional.  Yesterday with cough he began to feel sharp pains substernally into the left lateral chest wall with cough only.  He has not tried anything for his symptoms.  He states his symptoms do feel somewhat similar to prior asthma exacerbations but he has not had one since he was a teenager.  He does not have an albuterol inhaler at home.  He did receive an albuterol nebulizer in the ED prior to my assessment which he said significantly improved his symptoms.  His son at home had similar symptoms.  No recent travel or surgeries, no hemoptysis, no prior history of DVT or PE, and he is not on testosterone hormone placement therapy.  He smokes occasionally but not daily, denies recreational drug use or excessive alcohol intake.  The history is provided by the patient.    Past Medical History:  Diagnosis Date  . Asthma   . Bronchitis   . Retinal tear of right eye   . Sleep apnea     There are no active problems to display for this patient.   Past Surgical History:  Procedure Laterality Date  . ADENOIDECTOMY          Home Medications    Prior to Admission medications   Medication Sig Start Date End Date Taking? Authorizing Provider  azithromycin (ZITHROMAX) 250 MG tablet Take 2 tabs PO x 1 dose, then 1 tab PO QD x 4 days 06/21/13   Rhoderick Moody M, PA-C  cetirizine (ZYRTEC ALLERGY) 10 MG tablet  Take 1 tablet (10 mg total) by mouth daily. 03/06/18   Brentley Landfair A, PA-C  chlorpheniramine-HYDROcodone (TUSSIONEX PENNKINETIC ER) 10-8 MG/5ML LQCR Take 5 mLs by mouth every 12 (twelve) hours as needed for cough (cough). 06/21/13   [name], PA-C  fluticasone (FLONASE) 50 MCG/ACT nasal spray Place 2 sprays into both nostrils daily. 03/06/18   Kharis Lapenna A, PA-C  Guaifenesin (MUCINEX MAXIMUM STRENGTH) 1200 MG TB12 Take 1 tablet (1,200 mg total) by mouth every 12 (twelve) hours as needed. 06/21/13   [name], PA-C  trimethoprim-polymyxin b (POLYTRIM) ophthalmic solution Place 1 drop into the right eye every 4 (four) hours. 04/17/16   Dowless, Lester Kinsman, PA-C    Family History No family history on file.  Social History Social History   Tobacco Use  . Smoking status: Never Smoker  . Smokeless tobacco: Never Used  Substance Use Topics  . Alcohol use: No  . Drug use: No     Allergies   Dust mite extract; Grass extracts [gramineae pollens]; and Mold extract [trichophyton]   Review of Systems Review of Systems  Constitutional: Negative for chills and fever.  HENT: Positive for congestion and sore throat. Negative for drooling and trouble swallowing.   Respiratory: Positive for cough and shortness of breath.   Cardiovascular: Positive for chest pain.  Gastrointestinal: Negative for  abdominal pain, nausea and vomiting.  All other systems reviewed and are negative.    Physical Exam Updated Vital Signs BP 120/73   Pulse 69   Temp 98.1 F (36.7 C) (Oral)   Resp 20   SpO2 98%   Physical Exam  Constitutional: He appears well-developed and well-nourished. No distress.  HENT:  Head: Normocephalic and atraumatic.  Right Ear: Tympanic membrane and ear canal normal.  Left Ear: Tympanic membrane and ear canal normal.  Nose: Mucosal edema present. No nasal deformity or septal deviation. Right sinus exhibits no maxillary sinus tenderness and no frontal sinus  tenderness. Left sinus exhibits no maxillary sinus tenderness and no frontal sinus tenderness.  Mouth/Throat: Uvula is midline. No trismus in the jaw. No uvula swelling. Posterior oropharyngeal erythema present. Tonsils are 2+ on the right. Tonsils are 2+ on the left. No tonsillar exudate.  Tolerating secretions without difficulty, no sublingual abnormalities  Eyes: Conjunctivae are normal. Right eye exhibits no discharge. Left eye exhibits no discharge.  Neck: Normal range of motion. Neck supple. No JVD present. No tracheal deviation present.  Cardiovascular: Normal rate, regular rhythm and intact distal pulses.  2+ radial and DP/PT pulses bilaterally, Homans sign absent bilaterally, no lower extremity edema, no palpable cords, compartments are soft  Pulmonary/Chest: Effort normal. No accessory muscle usage. No tachypnea. He exhibits tenderness.  Equal rise and fall of chest, no increased work of breathing.  Speaking in full sentences without difficulty.  Abdominal: Soft. Bowel sounds are normal. He exhibits no distension. There is no tenderness.  Musculoskeletal: He exhibits no edema.       Right lower leg: Normal. He exhibits no tenderness and no edema.       Left lower leg: Normal. He exhibits no tenderness and no edema.  Tenderness to palpation of the chest wall parasternally and along the left lateral chest wall with no deformity, crepitus, ecchymosis, or flail segment.  Lymphadenopathy:    He has no cervical adenopathy.  Neurological: He is alert.  Skin: Skin is warm and dry. No erythema.  Psychiatric: He has a normal mood and affect. His behavior is normal.  Nursing note and vitals reviewed.    ED Treatments / Results  Labs (all labs ordered are listed, but only abnormal results are displayed) Labs Reviewed - No data to display  EKG EKG Interpretation  Date/Time:  Tuesday March 06 2018 18:30:39 EDT Ventricular Rate:  81 PR Interval:  158 QRS Duration: 72 QT  Interval:  352 QTC Calculation: 408 R Axis:   66 Text Interpretation:  Normal sinus rhythm Normal ECG Confirmed by Benjiman Core [phone]) on 03/06/2018 9:40:02 PM   Radiology Dg Chest 2 View  Result Date: 03/06/2018 CLINICAL DATA:  Shortness of breath with left-sided chest pain EXAM: CHEST - 2 VIEW COMPARISON:  06/21/2013 FINDINGS: The heart size and mediastinal contours are within normal limits. Both lungs are clear. The visualized skeletal structures are unremarkable. IMPRESSION: No active cardiopulmonary disease. Electronically Signed   By: Jasmine Pang M.D.   On: 03/06/2018 19:30    Procedures Procedures (including critical care time)  Medications Ordered in ED Medications  albuterol (PROVENTIL HFA;VENTOLIN HFA) 108 (90 Base) MCG/ACT inhaler 1 puff (has no administration in time range)  albuterol (PROVENTIL) (2.5 MG/3ML) 0.083% nebulizer solution 5 mg (5 mg Nebulization Given 03/06/18 1847)     Initial Impression / Assessment and Plan / ED Course  I have reviewed the triage vital signs and the nursing notes.  Pertinent labs &  imaging results that were available during my care of the patient were reviewed by me and considered in my medical decision making (see chart for details).     Patient with URI symptoms for 1 week developing some intermittent shortness of breath 2 days ago and pain to the chest with cough only.  Pain reproducible on palpation suggesting musculoskeletal etiology of symptoms.  EKG shows no acute or ischemic changes.  Symptoms do not appear to be cardiac in etiology, highly doubt ACS/MI.  They do appear to be more infectious in nature.  Doubt PE for the same reason.  Chest x-ray shows no acute cardiopulmonary abnormalities.  Symptoms do not appear to be consistent with strep pharyngitis.  Significant relief with albuterol nebulizer prior to my assessment.  Vital signs are stable and there is no increased work of breathing on my assessment.  He is not hypoxic.   Stable for discharge home with albuterol inhaler and supportive treatment.  Discussed strict ED return precautions. Pt verbalized understanding of and agreement with plan and is safe for discharge home at this time.   Final Clinical Impressions(s) / ED Diagnoses   Final diagnoses:  Acute chest wall pain  URI with cough and congestion    ED Discharge Orders         Ordered    fluticasone (FLONASE) 50 MCG/ACT nasal spray  Daily     03/06/18 2208    cetirizine (ZYRTEC ALLERGY) 10 MG tablet  Daily     03/06/18 2208           Jeanie Sewer, PA-C 03/06/18 2211    Benjiman Core, MD 03/07/18 0003

## 2018-03-06 NOTE — ED Notes (Signed)
E-signature not available, pt verbalized understanding of DC instructions and prescriptions 

## 2018-03-06 NOTE — Discharge Instructions (Addendum)
Drink plenty of water and get plenty of rest. Gargle warm salt water and spit it out for sore throat. May also use cough drops, warm teas, etc. Take flonase to decrease nasal congestion. Zyrtec or Allegra for nasal congestion and scratchy throat.  Can also use other over-the-counter cold medicines.  You can alternate 600 mg of ibuprofen and [phone] mg of Tylenol every 3 hours as needed for pain. Do not exceed 4000 mg of Tylenol daily.  Take ibuprofen with food to avoid upset stomach issues.  Use your albuterol inhaler every 4-6 hours as needed for shortness of breath.   Followup with your primary care doctor in 5-7 days for recheck of ongoing symptoms. Return to emergency department for emergent changing or worsening of symptoms such as worsening shortness of breath despite use of albuterol, throat tightness, facial swelling, fever not controlled by ibuprofen or Tylenol,difficulty breathing, or chest pain.

## 2018-04-09 ENCOUNTER — Emergency Department (HOSPITAL_BASED_OUTPATIENT_CLINIC_OR_DEPARTMENT_OTHER): Payer: 59

## 2018-04-09 ENCOUNTER — Emergency Department (HOSPITAL_COMMUNITY)
Admission: EM | Admit: 2018-04-09 | Discharge: 2018-04-09 | Disposition: A | Discharge status: Home / Self Care | Payer: 59 | Attending: Emergency Medicine | Admitting: Emergency Medicine

## 2018-04-09 ENCOUNTER — Emergency Department (HOSPITAL_COMMUNITY): Payer: 59

## 2018-04-09 ENCOUNTER — Encounter (HOSPITAL_COMMUNITY): Payer: Self-pay

## 2018-04-09 ENCOUNTER — Other Ambulatory Visit: Payer: Self-pay

## 2018-04-09 DIAGNOSIS — J45909 Unspecified asthma, uncomplicated: Secondary | ICD-10-CM | POA: Insufficient documentation

## 2018-04-09 DIAGNOSIS — M79671 Pain in right foot: Secondary | ICD-10-CM | POA: Diagnosis not present

## 2018-04-09 DIAGNOSIS — M7989 Other specified soft tissue disorders: Secondary | ICD-10-CM

## 2018-04-09 DIAGNOSIS — Z79899 Other long term (current) drug therapy: Secondary | ICD-10-CM | POA: Insufficient documentation

## 2018-04-09 MED ORDER — IBUPROFEN 400 MG PO TABS
600.0000 mg | ORAL_TABLET | Freq: Once | ORAL | Status: AC
  Administered 2018-04-09: 600 mg via ORAL
  Filled 2018-04-09: qty 1
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
## 2018-04-09 NOTE — Progress Notes (Signed)
RLE venous duplex prelim: negative for DVT.  Charly Hunton Eunice, RDMS, RVT  

## 2018-04-09 NOTE — Discharge Instructions (Addendum)
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
## 2021-01-04 ENCOUNTER — Ambulatory Visit (HOSPITAL_COMMUNITY)
Admission: EM | Admit: 2021-01-04 | Discharge: 2021-01-04 | Disposition: A | Discharge status: Home / Self Care | Payer: 59 | Attending: Internal Medicine | Admitting: Internal Medicine

## 2021-01-04 ENCOUNTER — Other Ambulatory Visit: Payer: Self-pay

## 2021-01-04 ENCOUNTER — Encounter (HOSPITAL_COMMUNITY): Payer: Self-pay | Admitting: *Deleted

## 2021-01-04 DIAGNOSIS — B349 Viral infection, unspecified: Secondary | ICD-10-CM | POA: Insufficient documentation

## 2021-01-04 DIAGNOSIS — Z20822 Contact with and (suspected) exposure to covid-19: Secondary | ICD-10-CM | POA: Insufficient documentation

## 2021-01-04 MED ORDER — ONDANSETRON 4 MG PO TBDP: 4.0000 mg | ORAL_TABLET | Freq: Three times a day (TID) | ORAL | 0 refills | Status: DC | PRN

## 2021-01-04 MED ORDER — BENZONATATE 100 MG PO CAPS: 100.0000 mg | ORAL_CAPSULE | Freq: Three times a day (TID) | ORAL | 0 refills | Status: DC | PRN

## 2021-01-04 MED ORDER — IBUPROFEN 600 MG PO TABS: 600.0000 mg | ORAL_TABLET | Freq: Four times a day (QID) | ORAL | 0 refills | Status: DC | PRN

## 2021-01-04 NOTE — ED Provider Notes (Signed)
[facility]    CSN: [id number] Arrival date & time: 01/04/21  1557      History   Chief Complaint Chief Complaint  Patient presents with   Cough   Fatigue   Nausea    HPI Oscar Morris is a 35 y.o. male comes to the urgent care with 2 to 3-day history of nonproductive cough, fatigue, nausea without vomiting.  Patient's son had a similar symptoms about a week prior to the onset of the patient's symptoms.  The patient's son he is improving with supportive care.  Patient denies any dizziness, near syncope or syncopal episodes.  No abdominal pain or distention.  His stools have been loose but no diarrhea.  Patient has generalized body aches.  Some other family members have tested positive for COVID-19 virus but the patient has not been in close proximity to any of those family members. HPI  Past Medical History:  Diagnosis Date   Asthma    Bronchitis    Retinal tear of right eye    Sleep apnea     There are no problems to display for this patient.   Past Surgical History:  Procedure Laterality Date   ADENOIDECTOMY         Home Medications    Prior to Admission medications   Medication Sig Start Date End Date Taking? Authorizing Provider  benzonatate (TESSALON) 100 MG capsule Take 1 capsule (100 mg total) by mouth 3 (three) times daily as needed for cough. 01/04/21  Yes Criselda Starke, Britta Mccreedy, MD  ibuprofen (ADVIL) 600 MG tablet Take 1 tablet (600 mg total) by mouth every 6 (six) hours as needed. 01/04/21  Yes Sayer Masini, Britta Mccreedy, MD  ondansetron (ZOFRAN ODT) 4 MG disintegrating tablet Take 1 tablet (4 mg total) by mouth every 8 (eight) hours as needed for nausea or vomiting. 01/04/21  Yes Nachman Sundt, Britta Mccreedy, MD  Guaifenesin [facility] MAXIMUM STRENGTH) 1200 MG TB12 Take 1 tablet (1,200 mg total) by mouth every 12 (twelve) hours as needed. 06/21/13   [name], PA-C    Family History History reviewed. No pertinent family history.  Social History Social History    Tobacco Use   Smoking status: Never   Smokeless tobacco: Never  Substance Use Topics   Alcohol use: No   Drug use: No     Allergies   Dust mite extract, Grass extracts [gramineae pollens], and Mold extract [trichophyton]   Review of Systems Review of Systems  Constitutional:  Positive for fatigue. Negative for chills and fever.  HENT:  Positive for congestion and rhinorrhea.   Respiratory:  Positive for cough. Negative for shortness of breath.   Cardiovascular: Negative.   Gastrointestinal: Negative.     Physical Exam Triage Vital Signs ED Triage Vitals  Enc Vitals Group     BP 01/04/21 1725 118/77     Pulse Rate 01/04/21 1725 64     Resp 01/04/21 1725 18     Temp 01/04/21 1725 99.1 F (37.3 C)     Temp src --      SpO2 01/04/21 1725 98 %     Weight --      Height --      Head Circumference --      Peak Flow --      Pain Score 01/04/21 1726 4     Pain Loc --      Pain Edu? --      Excl. in GC? --    No data  found.  Updated Vital Signs BP 118/77   Pulse 64   Temp 99.1 F (37.3 C)   Resp 18   SpO2 98%   Visual Acuity Right Eye Distance:   Left Eye Distance:   Bilateral Distance:    Right Eye Near:   Left Eye Near:    Bilateral Near:     Physical Exam Vitals and nursing note reviewed.  Constitutional:      General: He is not in acute distress.    Appearance: He is not ill-appearing.  Cardiovascular:     Rate and Rhythm: Normal rate and regular rhythm.     Pulses: Normal pulses.     Heart sounds: Normal heart sounds.  Pulmonary:     Effort: Pulmonary effort is normal.     Breath sounds: Normal breath sounds.  Abdominal:     General: Bowel sounds are normal.     Palpations: Abdomen is soft.  Musculoskeletal:        General: Normal range of motion.  Neurological:     Mental Status: He is alert.     UC Treatments / Results  Labs (all labs ordered are listed, but only abnormal results are displayed) Labs Reviewed  SARS CORONAVIRUS 2  (TAT 6-24 HRS)    EKG   Radiology No results found.  Procedures Procedures (including critical care time)  Medications Ordered in UC Medications - No data to display  Initial Impression / Assessment and Plan / UC Course  I have reviewed the triage vital signs and the nursing notes.  Pertinent labs & imaging results that were available during my care of the patient were reviewed by me and considered in my medical decision making (see chart for details).     1.  Acute viral syndrome: Increase oral fluid intake Zofran as needed for nausea/vomiting Tessalon Perles as needed for cough Ibuprofen as needed for body aches and/or fever COVID-19 PCR test has been sent We will call you with recommendations if COVID test is positive Final Clinical Impressions(s) / UC Diagnoses   Final diagnoses:  Acute viral syndrome     Discharge Instructions      Increase oral fluid intake Take medications as prescribed We will call you with recommendations if labs are abnormal Return to urgent care if symptoms worsen.   ED Prescriptions     Medication Sig Dispense Auth. Provider   ondansetron (ZOFRAN ODT) 4 MG disintegrating tablet Take 1 tablet (4 mg total) by mouth every 8 (eight) hours as needed for nausea or vomiting. 20 tablet Mariusz Jubb, Britta Mccreedy, MD   benzonatate (TESSALON) 100 MG capsule Take 1 capsule (100 mg total) by mouth 3 (three) times daily as needed for cough. 21 capsule Brightyn Mozer, Britta Mccreedy, MD   ibuprofen (ADVIL) 600 MG tablet Take 1 tablet (600 mg total) by mouth every 6 (six) hours as needed. 30 tablet Gavon Majano, Britta Mccreedy, MD      PDMP not reviewed this encounter.   [name], MD 01/04/21 [phone]

## 2021-01-04 NOTE — Discharge Instructions (Addendum)
Increase oral fluid intake Take medications as prescribed We will call you with recommendations if labs are abnormal Return to urgent care if symptoms worsen. 

## 2021-01-04 NOTE — ED Triage Notes (Signed)
Pt reports cough and fatigue. Pt reports his son has been sick.

## 2021-01-05 LAB — SARS CORONAVIRUS 2 (TAT 6-24 HRS): SARS Coronavirus 2: NEGATIVE

## 2021-04-17 ENCOUNTER — Emergency Department (HOSPITAL_COMMUNITY)
Admission: EM | Admit: 2021-04-17 | Discharge: 2021-04-18 | Disposition: A | Discharge status: Home / Self Care | Payer: Self-pay | Attending: Emergency Medicine | Admitting: Emergency Medicine

## 2021-04-17 ENCOUNTER — Encounter (HOSPITAL_COMMUNITY): Payer: Self-pay | Admitting: *Deleted

## 2021-04-17 ENCOUNTER — Other Ambulatory Visit: Payer: Self-pay

## 2021-04-17 ENCOUNTER — Emergency Department (HOSPITAL_COMMUNITY): Payer: Self-pay

## 2021-04-17 DIAGNOSIS — R0602 Shortness of breath: Secondary | ICD-10-CM

## 2021-04-17 DIAGNOSIS — J029 Acute pharyngitis, unspecified: Secondary | ICD-10-CM

## 2021-04-17 DIAGNOSIS — J101 Influenza due to other identified influenza virus with other respiratory manifestations: Secondary | ICD-10-CM | POA: Insufficient documentation

## 2021-04-17 DIAGNOSIS — Z20822 Contact with and (suspected) exposure to covid-19: Secondary | ICD-10-CM | POA: Insufficient documentation

## 2021-04-17 DIAGNOSIS — J45909 Unspecified asthma, uncomplicated: Secondary | ICD-10-CM | POA: Insufficient documentation

## 2021-04-17 LAB — COMPREHENSIVE METABOLIC PANEL
ALT: 44 U/L (ref 0–44)
AST: 53 U/L — ABNORMAL HIGH (ref 15–41)
Albumin: 3.8 g/dL (ref 3.5–5.0)
Alkaline Phosphatase: 68 U/L (ref 38–126)
Anion gap: 11 (ref 5–15)
BUN: 10 mg/dL (ref 6–20)
CO2: 19 mmol/L — ABNORMAL LOW (ref 22–32)
Calcium: 8.9 mg/dL (ref 8.9–10.3)
Chloride: 104 mmol/L (ref 98–111)
Creatinine, Ser: 1.15 mg/dL (ref 0.61–1.24)
GFR, Estimated: >60 mL/min (ref >60)
Glucose, Bld: 165 mg/dL — ABNORMAL HIGH (ref 70–99)
Potassium: 3.8 mmol/L (ref 3.5–5.1)
Sodium: 134 mmol/L — ABNORMAL LOW (ref 135–145)
Total Bilirubin: 0.9 mg/dL (ref 0.3–1.2)
Total Protein: 6.7 g/dL (ref 6.5–8.1)

## 2021-04-17 LAB — CBC WITH DIFFERENTIAL/PLATELET
Abs Immature Granulocytes: 0.02 10*3/uL (ref 0.00–0.07)
Basophils Absolute: 0 10*3/uL (ref 0.0–0.1)
Basophils Relative: 0 %
Eosinophils Absolute: 0.2 10*3/uL (ref 0.0–0.5)
Eosinophils Relative: 2 %
HCT: 46.2 % (ref 39.0–52.0)
Hemoglobin: 15.6 g/dL (ref 13.0–17.0)
Immature Granulocytes: 0 %
Lymphocytes Relative: 26 %
Lymphs Abs: 2.5 10*3/uL (ref 0.7–4.0)
MCH: 31.2 pg (ref 26.0–34.0)
MCHC: 33.8 g/dL (ref 30.0–36.0)
MCV: 92.4 fL (ref 80.0–100.0)
Monocytes Absolute: 0.9 10*3/uL (ref 0.1–1.0)
Monocytes Relative: 9 %
Neutro Abs: 6 10*3/uL (ref 1.7–7.7)
Neutrophils Relative %: 63 %
Platelets: UNDETERMINED 10*3/uL (ref 150–400)
RBC: 5 MIL/uL (ref 4.22–5.81)
RDW: 13.2 % (ref 11.5–15.5)
Smear Review: UNDETERMINED
WBC: 9.6 10*3/uL (ref 4.0–10.5)
nRBC: 0 % (ref 0.0–0.2)

## 2021-04-17 LAB — I-STAT CHEM 8, ED
BUN: 11 mg/dL (ref 6–20)
Calcium, Ion: 1.04 mmol/L — ABNORMAL LOW (ref 1.15–1.40)
Chloride: 103 mmol/L (ref 98–111)
Creatinine, Ser: 1.1 mg/dL (ref 0.61–1.24)
Glucose, Bld: 166 mg/dL — ABNORMAL HIGH (ref 70–99)
HCT: 47 % (ref 39.0–52.0)
Hemoglobin: 16 g/dL (ref 13.0–17.0)
Potassium: 3.6 mmol/L (ref 3.5–5.1)
Sodium: 137 mmol/L (ref 135–145)
TCO2: 22 mmol/L (ref 22–32)

## 2021-04-17 LAB — LIPASE, BLOOD: Lipase: 44 U/L (ref 11–51)

## 2021-04-17 LAB — RESP PANEL BY RT-PCR (FLU A&B, COVID) ARPGX2
Influenza A by PCR: POSITIVE — AB
Influenza B by PCR: NEGATIVE
SARS Coronavirus 2 by RT PCR: NEGATIVE

## 2021-04-17 LAB — TROPONIN I (HIGH SENSITIVITY): Troponin I (High Sensitivity): 6 ng/L (ref <18)

## 2021-04-17 MED ORDER — EPINEPHRINE 0.3 MG/0.3ML IJ SOAJ
INTRAMUSCULAR | Status: AC
  Administered 2021-04-17: 0.3 mg
  Filled 2021-04-17: qty 0.3

## 2021-04-17 MED ORDER — IOHEXOL 350 MG/ML SOLN
100.0000 mL | Freq: Once | INTRAVENOUS | Status: AC | PRN
  Administered 2021-04-17: 100 mL via INTRAVENOUS

## 2021-04-17 MED ORDER — DIPHENHYDRAMINE HCL 50 MG/ML IJ SOLN
25.0000 mg | Freq: Once | INTRAMUSCULAR | Status: AC
  Administered 2021-04-17: 25 mg via INTRAVENOUS
  Filled 2021-04-17: qty 1

## 2021-04-17 MED ORDER — ALUM & MAG HYDROXIDE-SIMETH 200-200-20 MG/5ML PO SUSP
30.0000 mL | Freq: Once | ORAL | Status: AC
  Administered 2021-04-17: 30 mL via ORAL
  Filled 2021-04-17: qty 30

## 2021-04-17 MED ORDER — LIDOCAINE VISCOUS HCL 2 % MT SOLN
15.0000 mL | Freq: Once | OROMUCOSAL | Status: AC
  Administered 2021-04-17: 15 mL via ORAL
  Filled 2021-04-17: qty 15

## 2021-04-17 MED ORDER — OSELTAMIVIR PHOSPHATE 75 MG PO CAPS: 75.0000 mg | ORAL_CAPSULE | Freq: Two times a day (BID) | ORAL | 0 refills | Status: DC

## 2021-04-17 MED ORDER — FAMOTIDINE IN NACL 20-0.9 MG/50ML-% IV SOLN
20.0000 mg | Freq: Once | INTRAVENOUS | Status: AC
  Administered 2021-04-17: 20 mg via INTRAVENOUS
  Filled 2021-04-17: qty 50

## 2021-04-17 MED ORDER — METHYLPREDNISOLONE SODIUM SUCC 125 MG IJ SOLR
125.0000 mg | Freq: Once | INTRAMUSCULAR | Status: AC
  Administered 2021-04-17: 125 mg via INTRAVENOUS
  Filled 2021-04-17: qty 2

## 2021-04-17 NOTE — ED Triage Notes (Signed)
Pt kids dx with the flu Wed.  This evening he had acute onset sob and vomiting.  States difficulty swallowing and feels as if something is in his throat.   HR 135 and afebrile.

## 2021-04-17 NOTE — Discharge Instructions (Addendum)
You are positive for influenza. Your CT scans are unremarkable except you have mild swelling in the back of your throat likely from influenza.  Take Tamiflu as prescribed.  Take Tylenol or Motrin for fever or sore throat  See your doctor for follow-up  Return to ER if you have trouble breathing, rash, worse sore throat, throat closing

## 2021-04-17 NOTE — ED Provider Notes (Signed)
Emergency Medicine Provider Triage Evaluation Note  Oscar Morris , a 35 y.o. male  was evaluated in triage.  Pt complains of sudden onset shortness of breath and feeling like something was in his throat.  He reports that his kids are at home with the flu and he has had flulike symptoms for the past couple of days however has been dealing with it at home.  Today he went to get his kids ready for a bath.  He states that he washes face with antibacterial soap which she has used many times before.  Suddenly began feeling like something was in his throat and he had difficulty swallowing.  Patient states that he made himself throw up as he thought that that would help with the sensation however he has not had any relief.  Complains of shortness of breath related to feeling like something is stuck in his throat.  He states he has not eaten anything since breakfast this morning.  Denies any chest pain.    Review of Systems  Positive: + throat swelling, SOB Negative: - wheezing, rash  Physical Exam  BP 113/73   Pulse (!) 135   Temp 99.6 F (37.6 C) (Oral)   Resp 16   Ht 5\' 10"  (1.778 m)   Wt 102.5 kg   SpO2 97%   BMI 32.43 kg/m  Gen:   Awake. Coughing/gagging however speaking in full sentences without difficulty.  Resp:  Normal effort. LCTAB without wheezing MSK:   Moves extremities without difficulty  Other:  Airway patent. NO erythema to posterior oropharynx. No edema. Uvula midline.   Medical Decision Making  Medically screening exam initiated at 7:27 PM.  Appropriate orders placed.  Oscar Morris was informed that the remainder of the evaluation will be completed by another provider, this initial triage assessment does not replace that evaluation, and the importance of remaining in the ED until their evaluation is complete.  Presenting significantly tachycardic in the 130s to 140s with an erythematous face.  He states this happens when he vomits.  Concern for possible allergic reaction  at this time.  Epi provided.  Nursing staff aware patient should be brought back to a room immediately.  There is no room currently per charge nurse.  IV started in triage.  Additional Benadryl, Pepcid, Solu-Medrol ordered.  Will obtain labs at this time and COVID swab.   Oscar Nakayama, PA-C 04/17/21 1930    13/12/22, MD 04/18/21 [phone]

## 2021-04-17 NOTE — ED Notes (Signed)
Patient transported to CT 

## 2021-04-17 NOTE — ED Provider Notes (Addendum)
[facility] EMERGENCY DEPARTMENT Provider Note   CSN: [id number] Arrival date & time: 04/17/21  1856     History Chief Complaint  Patient presents with   URI   Shortness of Breath    Oscar Morris is a 35 y.o. male history of asthma here presenting with shortness of breath and sore throat.  Patient states that he was starting water for his kids bath and did not wash his face with soap.  He states that he uses soap before.  He then suddenly had trouble breathing.  He felt like he needs to vomit so try to gag himself.  He then had some vomiting and then had sudden onset of shortness of breath.  He then felt like something was stuck in his throat.  He also then had chest pain and abdominal pain.  He was noted to have erythema of his face and was thought to have anaphylaxis in triage and was given epinephrine and Pepcid and Benadryl.  He was also noted to be in tachycardic in the 140s initially.  Patient denies any new allergens.  The history is provided by the patient.      Past Medical History:  Diagnosis Date   Asthma    Bronchitis    Retinal tear of right eye    Sleep apnea     There are no problems to display for this patient.   Past Surgical History:  Procedure Laterality Date   ADENOIDECTOMY         No family history on file.  Social History   Tobacco Use   Smoking status: Never   Smokeless tobacco: Never  Substance Use Topics   Alcohol use: No   Drug use: No    Home Medications Prior to Admission medications   Medication Sig Start Date End Date Taking? Authorizing Provider  benzonatate (TESSALON) 100 MG capsule Take 1 capsule (100 mg total) by mouth 3 (three) times daily as needed for cough. 01/04/21   Lamptey, Britta Mccreedy, MD  Guaifenesin [facility] MAXIMUM STRENGTH) 1200 MG TB12 Take 1 tablet (1,200 mg total) by mouth every 12 (twelve) hours as needed. 06/21/13   [name], PA-C  ibuprofen (ADVIL) 600 MG tablet Take 1 tablet (600 mg  total) by mouth every 6 (six) hours as needed. 01/04/21   Lamptey, Britta Mccreedy, MD  ondansetron (ZOFRAN ODT) 4 MG disintegrating tablet Take 1 tablet (4 mg total) by mouth every 8 (eight) hours as needed for nausea or vomiting. 01/04/21   [name], MD    Allergies    Dust mite extract, Grass extracts [gramineae pollens], and Mold extract [trichophyton]  Review of Systems   Review of Systems  HENT:  Positive for trouble swallowing.   Respiratory:  Positive for shortness of breath.   All other systems reviewed and are negative.  Physical Exam Updated Vital Signs BP 125/79   Pulse 84   Temp 99.6 F (37.6 C) (Oral)   Resp (!) 27   Ht 5\' 10"  (1.778 m)   Wt 102.5 kg   SpO2 96%   BMI 32.43 kg/m   Physical Exam Vitals and nursing note reviewed.  Constitutional:      Comments: Uncomfortable, moderate distress  HENT:     Head: Normocephalic.     Comments: There is some swelling on the lower part of his face.  No obvious petechiae or urticaria    Mouth/Throat:     Pharynx: Oropharynx is clear.  Comments: No obvious foreign body in the posterior pharynx.  The posterior pharynx does not appear swollen.  No tongue swelling Eyes:     Extraocular Movements: Extraocular movements intact.     Pupils: Pupils are equal, round, and reactive to light.  Neck:     Comments: Neck swollen, no obvious stridor. Thyroid not enlarged or tender  Cardiovascular:     Rate and Rhythm: Normal rate and regular rhythm.  Pulmonary:     Comments: Tachypneic  Abdominal:     General: Bowel sounds are normal.     Palpations: Abdomen is soft.     Comments: Mild epigastric tenderness   Musculoskeletal:        General: Normal range of motion.  Skin:    General: Skin is warm.     Capillary Refill: Capillary refill takes less than 2 seconds.  Neurological:     General: No focal deficit present.     Mental Status: He is oriented to person, place, and time.  Psychiatric:        Mood and Affect: Mood  normal.        Behavior: Behavior normal.    ED Results / Procedures / Treatments   Labs (all labs ordered are listed, but only abnormal results are displayed) Labs Reviewed  RESP PANEL BY RT-PCR (FLU A&B, COVID) ARPGX2 - Abnormal; Notable for the following components:      Result Value   Influenza A by PCR POSITIVE (*)    All other components within normal limits  COMPREHENSIVE METABOLIC PANEL - Abnormal; Notable for the following components:   Sodium 134 (*)    CO2 19 (*)    Glucose, Bld 165 (*)    AST 53 (*)    All other components within normal limits  I-STAT CHEM 8, ED - Abnormal; Notable for the following components:   Glucose, Bld 166 (*)    Calcium, Ion 1.04 (*)    All other components within normal limits  CBC WITH DIFFERENTIAL/PLATELET  LIPASE, BLOOD  TROPONIN I (HIGH SENSITIVITY)    EKG EKG Interpretation  Date/Time:  Saturday April 17 2021 19:10:06 EST Ventricular Rate:  146 PR Interval:  132 QRS Duration: 68 QT Interval:  340 QTC Calculation: 529 R Axis:   86 Text Interpretation: Sinus tachycardia Otherwise normal ECG Since last tracing rate faster Confirmed by Wandra Arthurs [phone]) on 04/17/2021 8:19:55 PM  Radiology CT Soft Tissue Neck W Contrast  Result Date: 04/17/2021 CLINICAL DATA:  Initial evaluation for epiglottitis or tonsillitis suspected. Neck abscess. EXAM: CT NECK WITH CONTRAST TECHNIQUE: Multidetector CT imaging of the neck was performed using the standard protocol following the bolus administration of intravenous contrast. CONTRAST:  189mL OMNIPAQUE IOHEXOL 350 MG/ML SOLN COMPARISON:  None. FINDINGS: Pharynx and larynx: Oral cavity within normal limits. No acute inflammatory changes seen about the dentition. Palatine tonsils are mildly prominent and enhancing bilaterally, which could reflect changes of mild and/or early acute tonsillitis. No discrete tonsillar or peritonsillar abscess. Probable mild mucosal edema within the adjacent oropharynx.  Nasopharynx within normal limits. No retropharyngeal collection or swelling. Epiglottis normal. Vallecula clear. Hypopharynx and supraglottic larynx normal. Glottis normal. Subglottic airway patent clear. Salivary glands: Salivary glands including the parotid and submandibular glands are normal. Thyroid: Few scattered thyroid nodules are seen, largest of which measures 7 mm on the left . These are of doubtful significance given size, with no follow-up imaging recommended. Thyroid otherwise unremarkable. (Ref: J Am Coll Radiol. 2015 Feb;12(2): 143-50). Lymph nodes:  No enlarged or pathologic adenopathy within the neck. Vascular: Normal intravascular enhancement seen throughout the neck. Limited intracranial: Unremarkable. Visualized orbits: Unremarkable. Mastoids and visualized paranasal sinuses: Mild scattered mucosal thickening noted within the ethmoidal air cells and maxillary sinuses. Visualized paranasal sinuses are otherwise clear. Mastoid air cells and middle ear cavities are well pneumatized and free of fluid. Skeleton: No discrete or worrisome osseous lesions. No other visible acute osseous finding. Upper chest: Visualized upper chest demonstrates no acute finding. Other: None. IMPRESSION: 1. Mildly prominence of the palatine tonsils bilaterally with mild mucosal edema within the adjacent oropharynx. Findings could reflect mild and/or early changes of tonsillitis/pharyngitis. No discrete abscess or drainable fluid collection. No discrete tonsillar or peritonsillar abscess. 2. No other acute abnormality within the neck. Electronically Signed   By: Jeannine Boga M.D.   On: 04/17/2021 23:34   DG Chest Port 1 View  Result Date: 04/17/2021 CLINICAL DATA:  Shortness of breath. EXAM: PORTABLE CHEST 1 VIEW COMPARISON:  03/06/2018 FINDINGS: The cardiomediastinal contours are normal. Minor atelectasis in the lung bases. Pulmonary vasculature is normal. No consolidation, pleural effusion, or pneumothorax. No  acute osseous abnormalities are seen. IMPRESSION: Minor bibasilar atelectasis. Electronically Signed   By: Keith Rake M.D.   On: 04/17/2021 20:56   CT Angio Chest/Abd/Pel for Dissection W and/or Wo Contrast  Result Date: 04/17/2021 CLINICAL DATA:  Abdominal pain, aortic dissection suspected EXAM: CT ANGIOGRAPHY CHEST, ABDOMEN AND PELVIS TECHNIQUE: Non-contrast CT of the chest was initially obtained. Multidetector CT imaging through the chest, abdomen and pelvis was performed using the standard protocol during bolus administration of intravenous contrast. Multiplanar reconstructed images and MIPs were obtained and reviewed to evaluate the vascular anatomy. CONTRAST:  159mL OMNIPAQUE IOHEXOL 350 MG/ML SOLN COMPARISON:  None. FINDINGS: CTA CHEST FINDINGS Cardiovascular: The heart is normal in size and no pericardial effusion is seen. The aorta is normal in caliber without evidence of aneurysm or dissection. The pulmonary trunk is normal in caliber. Mediastinum/Nodes: Scattered nodules are noted in the thyroid gland. The trachea and esophagus are within normal limits. No mediastinal, axillary, or hilar lymphadenopathy. Lungs/Pleura: Dependent atelectasis is noted bilaterally. No effusion or pneumothorax. Musculoskeletal: No chest wall abnormality. No acute or significant osseous findings. Review of the MIP images confirms the above findings. CTA ABDOMEN AND PELVIS FINDINGS VASCULAR Aorta: Normal caliber aorta without aneurysm, dissection, vasculitis or significant stenosis. Celiac: Patent without evidence of aneurysm, dissection, vasculitis or significant stenosis. SMA: Patent without evidence of aneurysm, dissection, vasculitis or significant stenosis. Renals: Both renal arteries are patent without evidence of aneurysm, dissection, vasculitis, fibromuscular dysplasia or significant stenosis. IMA: Patent without evidence of aneurysm, dissection, vasculitis or significant stenosis. Inflow: Patent without  evidence of aneurysm, dissection, vasculitis or significant stenosis. Veins: No obvious venous abnormality within the limitations of this arterial phase study. Review of the MIP images confirms the above findings. NON-VASCULAR Hepatobiliary: No focal liver abnormality is seen. No gallstones, gallbladder wall thickening, or biliary dilatation. Pancreas: Unremarkable. No pancreatic ductal dilatation or surrounding inflammatory changes. Spleen: Normal in size without focal abnormality. Adrenals/Urinary Tract: Adrenal glands are unremarkable. Kidneys are normal, without renal calculi or hydronephrosis. There is a hypodensity in the lower pole the right kidney, measuring 1.7 cm, likely cyst. Subcentimeter hypodensities are present in the kidneys bilaterally which are too small to further characterize. Bladder is unremarkable. Stomach/Bowel: Stomach is within normal limits. Appendix appears normal. No evidence of bowel wall thickening, distention, or inflammatory changes. Lymphatic: No abdominal or pelvic lymphadenopathy. Reproductive: Prostate  is unremarkable. Other: No free fluid. Small fat containing umbilical hernia is noted. Musculoskeletal: No acute or significant osseous findings. Review of the MIP images confirms the above findings. IMPRESSION: 1. No evidence of aortic aneurysm or dissection. 2. No acute process in the chest, abdomen, or pelvis. Electronically Signed   By: Brett Fairy M.D.   On: 04/17/2021 22:58    Procedures Procedures   Medications Ordered in ED Medications  EPINEPHrine (EPI-PEN) 0.3 mg/0.3 mL injection (0.3 mg  Given 04/17/21 1925)  methylPREDNISolone sodium succinate (SOLU-MEDROL) 125 mg/2 mL injection 125 mg (125 mg Intravenous Given 04/17/21 2001)  famotidine (PEPCID) IVPB 20 mg premix (0 mg Intravenous Stopped 04/17/21 2031)  diphenhydrAMINE (BENADRYL) injection 25 mg (25 mg Intravenous Given 04/17/21 2002)  alum & mag hydroxide-simeth (MAALOX/MYLANTA) 200-200-20 MG/5ML  suspension 30 mL (30 mLs Oral Given 04/17/21 2007)    And  lidocaine (XYLOCAINE) 2 % viscous mouth solution 15 mL (15 mLs Oral Given 04/17/21 2007)  iohexol (OMNIPAQUE) 350 MG/ML injection 100 mL (100 mLs Intravenous Contrast Given 04/17/21 2234)    ED Course  I have reviewed the triage vital signs and the nursing notes.  Pertinent labs & imaging results that were available during my care of the patient were reviewed by me and considered in my medical decision making (see chart for details).    MDM Rules/Calculators/A&P                           MARQUEL DENT is a 35 y.o. male here presenting with constellation of symptoms.  He has sudden onset shortness of breath and sore throat after he washed his face and after trying to vomit.  Concern for possible [place] from the vomiting which can also cause the neck swelling.  Also consider possible allergic reaction to the soap.  Finally considered dissection causing his symptoms.  We will get CT dissection study and CT neck.  We will also swab for COVID and get labs.  11:37 PM Dissection study is negative.  CT neck showed possible pharyngitis with no fluid collection. Patient's labs unremarkable.  Patient's COVID is negative but positive for flu.  Patient states that his kids have flu right now.  He states that his throat is feeling much better now.  He has no trouble breathing.  I do not think he has bacterial pharyngitis right now.  I think this is viral in nature.  At this point we will discharge patient home with Tamiflu  Final Clinical Impression(s) / ED Diagnoses Final diagnoses:  Influenza A    Rx / DC Orders ED Discharge Orders     None        Drenda Freeze, MD 04/17/21 2337    Drenda Freeze, MD 04/17/21 [phone]

## 2021-07-13 ENCOUNTER — Ambulatory Visit (HOSPITAL_COMMUNITY)
Admission: EM | Admit: 2021-07-13 | Discharge: 2021-07-13 | Disposition: A | Discharge status: Home / Self Care | Payer: BC Managed Care – PPO | Attending: Physician Assistant | Admitting: Physician Assistant

## 2021-07-13 ENCOUNTER — Other Ambulatory Visit: Payer: Self-pay

## 2021-07-13 ENCOUNTER — Encounter (HOSPITAL_COMMUNITY): Payer: Self-pay

## 2021-07-13 DIAGNOSIS — L03011 Cellulitis of right finger: Secondary | ICD-10-CM

## 2021-07-13 MED ORDER — MUPIROCIN 2 % EX OINT
1.0000 | TOPICAL_OINTMENT | Freq: Every day | CUTANEOUS | 0 refills | Status: AC
Start: 2021-07-13 — End: ?

## 2021-07-13 MED ORDER — AMOXICILLIN-POT CLAVULANATE 875-125 MG PO TABS: 1.0000 | ORAL_TABLET | Freq: Two times a day (BID) | ORAL | 0 refills | Status: DC

## 2021-07-13 NOTE — ED Provider Notes (Signed)
[facility]    CSN: [id number] Arrival date & time: 07/13/21  1500      History   Chief Complaint Chief Complaint  Patient presents with   Hand Pain    HPI JANCARLOS THRUN is a 36 y.o. male.   Patient presents today with a 1 day history of worsening right index finger pain.  He reports swelling at the lateral nailbed with associated aching/throbbing pain.  Pain is rated 10 on a 0-10 pain scale, no aggravating or alleviating factors identified.  He has not tried any over-the-counter medication from symptom management.  He does frequently bite his nails.  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  He denies any history of immunosuppression or recurrent skin infections.  Denies history of diabetes.  He denies any recent antibiotics in the past couple of months.  He is right-handed   Past Medical History:  Diagnosis Date   Asthma    Bronchitis    Retinal tear of right eye    Sleep apnea     There are no problems to display for this patient.   Past Surgical History:  Procedure Laterality Date   ADENOIDECTOMY         Home Medications    Prior to Admission medications   Medication Sig Start Date End Date Taking? Authorizing Provider  amoxicillin-clavulanate (AUGMENTIN) 875-125 MG tablet Take 1 tablet by mouth every 12 (twelve) hours. 07/13/21  Yes Cozy Veale, Denny Peon K, PA-C  mupirocin ointment (BACTROBAN) 2 % Apply 1 application topically daily. 07/13/21  Yes Lashaunta Sicard K, PA-C  benzonatate (TESSALON) 100 MG capsule Take 1 capsule (100 mg total) by mouth 3 (three) times daily as needed for cough. Patient not taking: Reported on 04/17/2021 01/04/21   [name], MD  Guaifenesin [facility] MAXIMUM STRENGTH) 1200 MG TB12 Take 1 tablet (1,200 mg total) by mouth every 12 (twelve) hours as needed. Patient not taking: Reported on 04/17/2021 06/21/13    [name], PA-C  ibuprofen (ADVIL) 600 MG tablet Take 1 tablet (600 mg total) by mouth every 6 (six) hours as needed. Patient not taking: Reported on 04/17/2021 01/04/21   [name], MD  multivitamin (ONE-A-DAY MEN'S) TABS tablet Take 1 tablet by mouth daily.    [provider]  ondansetron (ZOFRAN ODT) 4 MG disintegrating tablet Take 1 tablet (4 mg total) by mouth every 8 (eight) hours as needed for nausea or vomiting. Patient not taking: Reported on 04/17/2021 01/04/21   [name], MD  oseltamivir (TAMIFLU) 75 MG capsule Take 1 capsule (75 mg total) by mouth every 12 (twelve) hours. 04/17/21   [name], MD    Family History History reviewed. No pertinent family history.  Social History Social History   Tobacco Use   Smoking status: Never   Smokeless tobacco: Never  Substance Use Topics   Alcohol use: No   Drug use: No     Allergies   Dust mite extract, Grass extracts [gramineae pollens], and Mold extract [trichophyton]   Review of Systems Review of Systems  Constitutional:  Positive for activity change. Negative for appetite change, fatigue and fever.  Respiratory:  Negative for cough and shortness of breath.   Cardiovascular:  Negative for chest pain.  Gastrointestinal:  Negative for abdominal pain, diarrhea, nausea and vomiting.  Musculoskeletal:  Negative for arthralgias and  myalgias.  Skin:  Positive for color change and wound.  Neurological:  Negative for dizziness, light-headedness and headaches.    Physical Exam Triage Vital Signs ED Triage Vitals  Enc Vitals Group     BP 07/13/21 1622 120/77     Pulse Rate 07/13/21 1622 72     Resp 07/13/21 1622 18     Temp 07/13/21 1622 98.7 F (37.1 C)     Temp Source 07/13/21 1622 Oral     SpO2 07/13/21 1622 98 %     Weight --      Height --      Head Circumference --      Peak Flow --      Pain Score 07/13/21 1623 10     Pain Loc --      Pain Edu? --      Excl. in GC? --     No data found.  Updated Vital Signs BP 120/77 (BP Location: Left Arm)    Pulse 72    Temp 98.7 F (37.1 C) (Oral)    Resp 18    SpO2 98%   Visual Acuity Right Eye Distance:   Left Eye Distance:   Bilateral Distance:    Right Eye Near:   Left Eye Near:    Bilateral Near:     Physical Exam Vitals reviewed.  Constitutional:      General: He is awake.     Appearance: Normal appearance. He is well-developed. He is not ill-appearing.     Comments: Very pleasant male appears stated age no acute distress sitting comfortably in exam room  HENT:     Head: Normocephalic and atraumatic.     Mouth/Throat:     Pharynx: No oropharyngeal exudate, posterior oropharyngeal erythema or uvula swelling.  Cardiovascular:     Rate and Rhythm: Normal rate and regular rhythm.     Heart sounds: Normal heart sounds, S1 normal and S2 normal. No murmur heard.    Comments: Capillary refill within 2 seconds right fingers Pulmonary:     Effort: Pulmonary effort is normal.     Breath sounds: Normal breath sounds. No stridor. No wheezing, rhonchi or rales.     Comments: Clear to auscultation bilaterally Abdominal:     General: Bowel sounds are normal.     Palpations: Abdomen is soft.     Tenderness: There is no abdominal tenderness.  Skin:    Comments: Erythema and swelling at lateral nail bed with expression of purulent drainage following traction on this area.  No streaking or evidence of lymphangitis.  No evidence of felon.  Neurological:     Mental Status: He is alert.  Psychiatric:        Behavior: Behavior is cooperative.     UC Treatments / Results  Labs (all labs ordered are listed, but only abnormal results are displayed) Labs Reviewed - No data to display  EKG   Radiology No results found.  Procedures Procedures (including critical care time)  Medications Ordered in UC Medications - No data to display  Initial Impression / Assessment and Plan / UC Course  I have reviewed  the triage vital signs and the nursing notes.  Pertinent labs & imaging results that were available during my care of the patient were reviewed by me and considered in my medical decision making (see chart for details).     Paronychia drained with mild traction in clinic today.  We will start Augmentin twice daily for 7 days.  He  was encouraged to use warm soaks and apply Bactroban during dressing changes.  He can use Tylenol ibuprofen for pain.  Recommended that he avoid strenuous activity involving hand until symptoms improve; does not work again until Friday so no work note was provided.  Discussed that if he has any worsening symptoms including spread of redness or swelling, increased pain, fever, nausea, vomiting, decreased range of motion, numbness/paresthesias he needs to be seen immediately.  Strict return precautions given to which he expressed understanding.  Final Clinical Impressions(s) / UC Diagnoses   Final diagnoses:  Paronychia of right index finger     Discharge Instructions      Use warm soaks several times per days to encourage healing and drainage.  Keep area clean with soap and water and apply Bactroban ointment.  Start Augmentin twice daily.  Use Tylenol ibuprofen for pain.  If you do not have significant improvement of symptoms within 48 hours please return for reevaluation.  If at any point anything worsens and you have severe pain, fever, nausea/vomiting, numbness or tingling in your hand, difficulty moving your hand you need to go to the emergency room.     ED Prescriptions     Medication Sig Dispense Auth. Provider   mupirocin ointment (BACTROBAN) 2 % Apply 1 application topically daily. 22 g Hila Bolding K, PA-C   amoxicillin-clavulanate (AUGMENTIN) 875-125 MG tablet Take 1 tablet by mouth every 12 (twelve) hours. 14 tablet Rudie Sermons, Noberto Retort, PA-C      PDMP not reviewed this encounter.   Jeani Hawking, PA-C 07/13/21 1648

## 2021-07-13 NOTE — Discharge Instructions (Signed)
Use warm soaks several times per days to encourage healing and drainage.  Keep area clean with soap and water and apply Bactroban ointment.  Start Augmentin twice daily.  Use Tylenol ibuprofen for pain.  If you do not have significant improvement of symptoms within 48 hours please return for reevaluation.  If at any point anything worsens and you have severe pain, fever, nausea/vomiting, numbness or tingling in your hand, difficulty moving your hand you need to go to the emergency room.

## 2021-07-13 NOTE — ED Triage Notes (Signed)
Pt c/o pain with redness and swelling to rt and lt index finger since yesterday. 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 States pain going up rt arm today.

## 2021-12-09 IMAGING — DX DG CHEST 1V PORT
1 series · 1 of 1 positions shown · non-contrast
Comparison: 03/06/2018

CLINICAL DATA: Shortness of breath.

EXAM:
PORTABLE CHEST 1 VIEW

[chest]
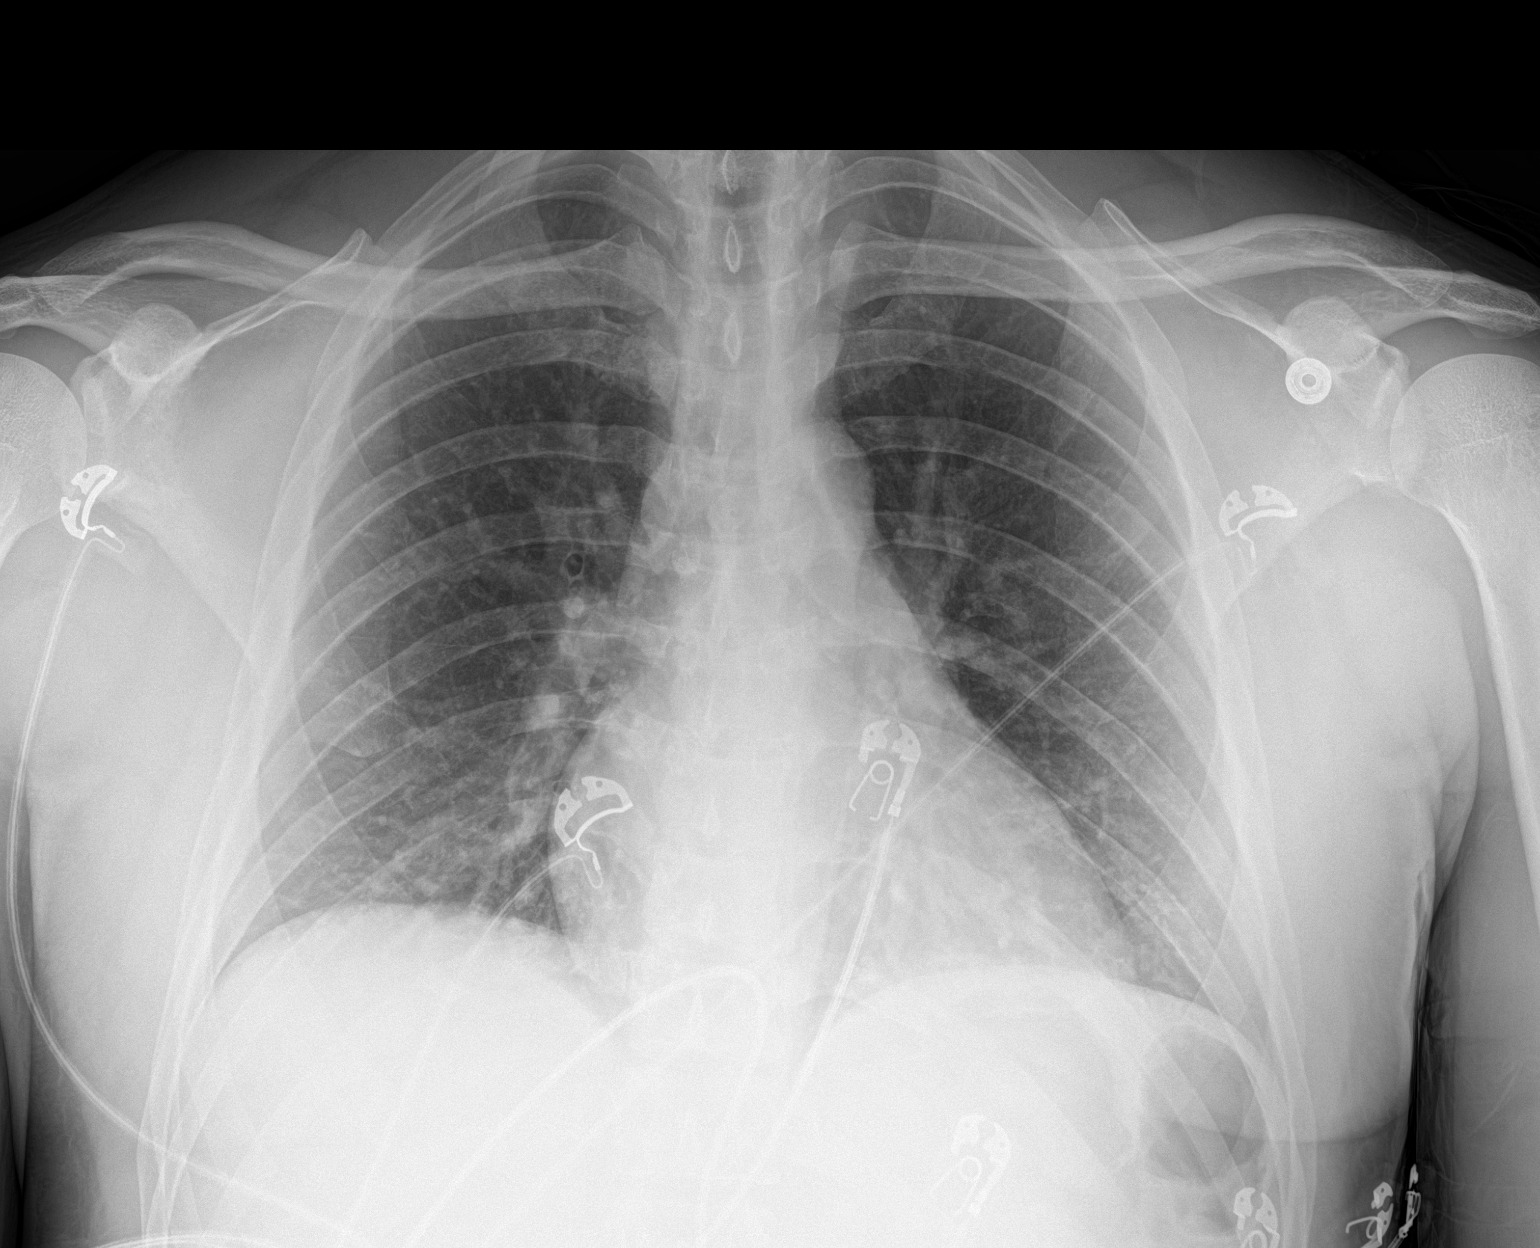

[1 of 1 positions shown; findings below may reference images not displayed]

FINDINGS: The cardiomediastinal contours are normal. Minor atelectasis in the
lung bases. Pulmonary vasculature is normal. No consolidation,
pleural effusion, or pneumothorax. No acute osseous abnormalities
are seen.
IMPRESSION: Minor bibasilar atelectasis.

## 2022-02-04 ENCOUNTER — Other Ambulatory Visit: Payer: Self-pay

## 2022-02-04 ENCOUNTER — Encounter (HOSPITAL_COMMUNITY): Payer: Self-pay | Admitting: Emergency Medicine

## 2022-02-04 ENCOUNTER — Emergency Department (HOSPITAL_COMMUNITY)
Admission: EM | Admit: 2022-02-04 | Discharge: 2022-02-05 | Disposition: A | Discharge status: Home / Self Care | Payer: BC Managed Care – PPO | Attending: Emergency Medicine | Admitting: Emergency Medicine

## 2022-02-04 DIAGNOSIS — H1031 Unspecified acute conjunctivitis, right eye: Secondary | ICD-10-CM | POA: Insufficient documentation

## 2022-02-04 NOTE — ED Triage Notes (Signed)
Patient reports red /itchy , watery right eye this morning , denies injury or vision loss.

## 2022-02-05 MED ORDER — TETRACAINE HCL 0.5 % OP SOLN
1.0000 [drp] | Freq: Once | OPHTHALMIC | Status: AC
  Administered 2022-02-05: 1 [drp] via OPHTHALMIC
  Filled 2022-02-05: qty 4

## 2022-02-05 MED ORDER — KETOROLAC TROMETHAMINE 0.5 % OP SOLN
1.0000 [drp] | Freq: Four times a day (QID) | OPHTHALMIC | Status: DC
  Administered 2022-02-05: 1 [drp] via OPHTHALMIC
  Filled 2022-02-05: qty 5

## 2022-02-05 MED ORDER — FLUORESCEIN SODIUM 1 MG OP STRP
1.0000 | ORAL_STRIP | Freq: Once | OPHTHALMIC | Status: AC
  Administered 2022-02-05: 1 via OPHTHALMIC
  Filled 2022-02-05: qty 1

## 2022-02-05 MED ORDER — CIPROFLOXACIN HCL 0.3 % OP SOLN
1.0000 [drp] | OPHTHALMIC | Status: DC
  Administered 2022-02-05: 1 [drp] via OPHTHALMIC
  Filled 2022-02-05: qty 2.5

## 2022-02-05 NOTE — Discharge Instructions (Signed)
Continue using drops as directed. Follow-up with your eye doctor on 9/11 as scheduled. Dr. Randon Goldsmith is on call-- if any issues arise between now and then please call his office to be seen sooner.

## 2022-02-05 NOTE — ED Provider Notes (Signed)
[facility] EMERGENCY DEPARTMENT Provider Note   CSN: [id number] Arrival date & time: 02/04/22  2315     History  Chief Complaint  Patient presents with   Conjunctivitis    Oscar Morris is a 36 y.o. male.  The history is provided by the patient and medical records.  Conjunctivitis   36 y.o. M here with right eye redness and irritation over the past few days.  States eye is watering, some photophobia.  Denies blurred vision or vision loss.  He does wear contact lenses, cleans them nightly.  Denies trauma or FB exposure to the eye.  He did use some OTC eye drops PTA which transiently helped with irritation.  Home Medications Prior to Admission medications   Medication Sig Start Date End Date Taking? Authorizing Provider  amoxicillin-clavulanate (AUGMENTIN) 875-125 MG tablet Take 1 tablet by mouth every 12 (twelve) hours. 07/13/21   Raspet, Noberto Retort, PA-C  benzonatate (TESSALON) 100 MG capsule Take 1 capsule (100 mg total) by mouth 3 (three) times daily as needed for cough. Patient not taking: Reported on 04/17/2021 01/04/21   [name], MD  Guaifenesin [facility] MAXIMUM STRENGTH) 1200 MG TB12 Take 1 tablet (1,200 mg total) by mouth every 12 (twelve) hours as needed. Patient not taking: Reported on 04/17/2021 06/21/13   [name], PA-C  ibuprofen (ADVIL) 600 MG tablet Take 1 tablet (600 mg total) by mouth every 6 (six) hours as needed. Patient not taking: Reported on 04/17/2021 01/04/21   [name], MD  multivitamin (ONE-A-DAY MEN'S) TABS tablet Take 1 tablet by mouth daily.    [provider]  mupirocin ointment (BACTROBAN) 2 % Apply 1 application topically daily. 07/13/21   Raspet, Erin K, PA-C  ondansetron (ZOFRAN ODT) 4 MG disintegrating tablet Take 1 tablet (4 mg total) by mouth every 8 (eight) hours as needed for nausea or vomiting. Patient not taking: Reported on 04/17/2021 01/04/21   [name], MD  oseltamivir (TAMIFLU) 75 MG  capsule Take 1 capsule (75 mg total) by mouth every 12 (twelve) hours. 04/17/21   [name], MD      Allergies    Dust mite extract, Grass extracts [gramineae pollens], and Mold extract [trichophyton]    Review of Systems   Review of Systems  Eyes:  Positive for photophobia, pain and redness.  All other systems reviewed and are negative.   Physical Exam Updated Vital Signs BP 122/85   Pulse 65   Temp 98.4 F (36.9 C) (Oral)   Resp 16   SpO2 98%   Physical Exam Vitals and nursing note reviewed.  Constitutional:      Appearance: He is well-developed.  HENT:     Head: Normocephalic and atraumatic.  Eyes:     Conjunctiva/sclera: Conjunctivae normal.     Pupils: Pupils are equal, round, and reactive to light.     Comments: No lid edema or erythema, conjunctiva injected without hemorrhage, EOMs intact, no nystagmus; fluorescein stain negative, no abrasion or ulcer, no uptake  Cardiovascular:     Rate and Rhythm: Normal rate and regular rhythm.     Heart sounds: Normal heart sounds.  Pulmonary:     Effort: Pulmonary effort is normal. No respiratory distress.     Breath sounds: Normal breath sounds. No rhonchi.  Abdominal:     General: Bowel sounds are normal.     Palpations: Abdomen is soft.     Tenderness: There is no abdominal tenderness. There is  no rebound.  Musculoskeletal:        General: Normal range of motion.     Cervical back: Normal range of motion.  Skin:    General: Skin is warm and dry.  Neurological:     Mental Status: He is alert and oriented to person, place, and time.     ED Results / Procedures / Treatments   Labs (all labs ordered are listed, but only abnormal results are displayed) Labs Reviewed - No data to display  EKG None  Radiology No results found.  Procedures Procedures    Medications Ordered in ED Medications  ciprofloxacin (CILOXAN) 0.3 % ophthalmic solution 1 drop (1 drop Both Eyes Given 02/05/22 0202)  ketorolac  (ACULAR) 0.5 % ophthalmic solution 1 drop (1 drop Right Eye Given 02/05/22 0203)  fluorescein ophthalmic strip 1 strip (1 strip Right Eye Given 02/05/22 0105)  tetracaine (PONTOCAINE) 0.5 % ophthalmic solution 1 drop (1 drop Right Eye Given 02/05/22 0105)    ED Course/ Medical Decision Making/ A&P                           Medical Decision Making Risk Prescription drug management.   36 y.o. M here with right eye pain over the past few days.  Does wear contact lenses, removes and cleans nightly.  No lid edema or erythema on exam.  Conjunctiva injected and eye is tearing.  No drainage.  EOMs intact, no nystagmus.  Negative fluorescein stain, no corneal ulcer or abrasion.  No uptake.  Given he does wear contact lenses, will start ciloxin drops for coverage of pseudomonas.  He has scheduled appt with his eye doctor 02/14/22 already, also given information for physician on call if acute changes in the interim.  Can return here for new concerns.  Final Clinical Impression(s) / ED Diagnoses Final diagnoses:  Acute conjunctivitis of right eye, unspecified acute conjunctivitis type    Rx / DC Orders ED Discharge Orders     None         [name], PA-C 02/05/22 0205    [name], MD 02/06/22 [phone]

## 2022-02-13 ENCOUNTER — Encounter (HOSPITAL_COMMUNITY): Payer: Self-pay

## 2022-02-13 ENCOUNTER — Other Ambulatory Visit: Payer: Self-pay

## 2022-02-13 ENCOUNTER — Emergency Department (HOSPITAL_COMMUNITY)
Admission: EM | Admit: 2022-02-13 | Discharge: 2022-02-13 | Disposition: A | Discharge status: Home / Self Care | Payer: Self-pay | Attending: Emergency Medicine | Admitting: Emergency Medicine

## 2022-02-13 DIAGNOSIS — J45909 Unspecified asthma, uncomplicated: Secondary | ICD-10-CM | POA: Insufficient documentation

## 2022-02-13 DIAGNOSIS — F10920 Alcohol use, unspecified with intoxication, uncomplicated: Secondary | ICD-10-CM | POA: Insufficient documentation

## 2022-02-13 DIAGNOSIS — Y909 Presence of alcohol in blood, level not specified: Secondary | ICD-10-CM | POA: Insufficient documentation

## 2022-02-13 NOTE — ED Notes (Signed)
Patients car was towed to:  [facility] [address] [place], [place] [address] [phone]

## 2022-02-13 NOTE — ED Provider Notes (Signed)
[facility] Emergency Department Provider Note MRN:  [id number]  Arrival date & time: 02/13/22     Chief Complaint   Alcohol Intoxication   History of Present Illness   Oscar Morris is a 36 y.o. year-old male with no pertinent past medical history presenting to the ED with chief complaint of alcohol intoxication.  Patient recently separated from his wife who "hates my guts".  Decided to drink alcohol this evening to take his mind off things.  Denies trauma, no SI or HI or AVH, has not been sleeping well.  Depressed recently.  Review of Systems  A thorough review of systems was obtained and all systems are negative except as noted in the HPI and PMH.   Patient's Health History    Past Medical History:  Diagnosis Date   Asthma    Bronchitis    Retinal tear of right eye    Sleep apnea     Past Surgical History:  Procedure Laterality Date   ADENOIDECTOMY      History reviewed. No pertinent family history.  Social History   Socioeconomic History   Marital status: Married    Spouse name: Not on file   Number of children: Not on file   Years of education: Not on file   Highest education level: Not on file  Occupational History   Not on file  Tobacco Use   Smoking status: Never   Smokeless tobacco: Never  Substance and Sexual Activity   Alcohol use: Yes   Drug use: No   Sexual activity: Not on file  Other Topics Concern   Not on file  Social History Narrative   Not on file   Social Determinants of Health   Financial Resource Strain: Not on file  Food Insecurity: Not on file  Transportation Needs: Not on file  Physical Activity: Not on file  Stress: Not on file  Social Connections: Not on file  Intimate Partner Violence: Not on file     Physical Exam   Vitals:   02/13/22 0413  BP: (!) 122/90  Pulse: (!) 105  Resp: 17  Temp: 97.6 F (36.4 C)  SpO2: 99%    CONSTITUTIONAL: Well-appearing, NAD NEURO/PSYCH:  Alert and  oriented x 3, no focal deficits EYES:  eyes equal and reactive ENT/NECK:  no LAD, no JVD CARDIO: Regular rate, well-perfused, normal S1 and S2 PULM:  CTAB no wheezing or rhonchi GI/GU:  non-distended, non-tender MSK/SPINE:  No gross deformities, no edema SKIN:  no rash, atraumatic   *Additional and/or pertinent findings included in MDM below  Diagnostic and Interventional Summary    EKG Interpretation  Date/Time:    Ventricular Rate:    PR Interval:    QRS Duration:   QT Interval:    QTC Calculation:   R Axis:     Text Interpretation:         Labs Reviewed - No data to display  No orders to display    Medications - No data to display   Procedures  /  Critical Care Procedures  ED Course and Medical Decision Making  Initial Impression and Ddx Alcohol intoxication, seems a bit depressed but does not seem to be at risk of harm to self or others.  Will allow time for metabolization and reassess.  Past medical/surgical history that increases complexity of ED encounter: None  Interpretation of Diagnostics Laboratory and/or imaging options to aid in the diagnosis/care of the patient were considered.  After careful history  and physical examination, it was determined that there was no indication for diagnostics at this time.  Patient Reassessment and Ultimate Disposition/Management     Patient doing well on reassessment, continues to have no indication for further testing or admission, appropriate for discharge.  Patient management required discussion with the following services or consulting groups:  None  Complexity of Problems Addressed Acute illness or injury that poses threat of life of bodily function  Additional Data Reviewed and Analyzed Further history obtained from: None  Additional Factors Impacting ED Encounter Risk None  Elmer Sow. Pilar Plate, MD [facility] Emergency Medicine [facility] [email or web address]  Final Clinical  Impressions(s) / ED Diagnoses     ICD-10-CM   1. Alcoholic intoxication without complication (HCC)  Q03.474       ED Discharge Orders     None        Discharge Instructions Discussed with and Provided to Patient:   Discharge Instructions   None      [name], MD 02/13/22 [phone]

## 2022-02-13 NOTE — ED Notes (Signed)
Patient given sandwich.  

## 2022-02-13 NOTE — ED Triage Notes (Addendum)
Patient BIB [facility]. Someone called 911 for a suspicious vehicle in their neighborhood, they went out and saw the heavily intoxicated patient. He said he drank 5 40 oz beers with some shots. [employer] arrived with patient. Vomited on scene.   RN asked patient what he drank he said 6 shots, that was it.

## 2022-02-13 NOTE — Discharge Instructions (Signed)
You were evaluated in the Emergency Department and after careful evaluation, we did not find any emergent condition requiring admission or further testing in the hospital.  Your exam/testing today is overall reassuring.  Please return to the Emergency Department if you experience any worsening of your condition.   Thank you for allowing us to be a part of your care. 

## 2022-06-13 ENCOUNTER — Ambulatory Visit (INDEPENDENT_AMBULATORY_CARE_PROVIDER_SITE_OTHER): Payer: Managed Care, Other (non HMO)

## 2022-06-13 ENCOUNTER — Ambulatory Visit
Admission: EM | Admit: 2022-06-13 | Discharge: 2022-06-13 | Disposition: A | Discharge status: Home / Self Care | Payer: Managed Care, Other (non HMO) | Attending: Emergency Medicine | Admitting: Emergency Medicine

## 2022-06-13 DIAGNOSIS — S46911D Strain of unspecified muscle, fascia and tendon at shoulder and upper arm level, right arm, subsequent encounter: Secondary | ICD-10-CM | POA: Diagnosis not present

## 2022-06-13 MED ORDER — KETOROLAC TROMETHAMINE 30 MG/ML IJ SOLN
30.0000 mg | Freq: Once | INTRAMUSCULAR | Status: AC
  Administered 2022-06-13: 30 mg via INTRAMUSCULAR

## 2022-06-13 MED ORDER — ACETAMINOPHEN 325 MG PO TABS
975.0000 mg | ORAL_TABLET | Freq: Once | ORAL | Status: AC
  Administered 2022-06-13: 975 mg via ORAL

## 2022-06-13 MED ORDER — MELOXICAM 15 MG PO TABS: 15.0000 mg | ORAL_TABLET | Freq: Every day | ORAL | 0 refills | Status: AC

## 2022-06-13 MED ORDER — ACETAMINOPHEN 500 MG PO TABS
1000.0000 mg | ORAL_TABLET | Freq: Three times a day (TID) | ORAL | 0 refills | Status: AC
Start: 2022-06-13 — End: 2022-07-13

## 2022-06-13 MED ORDER — BACLOFEN 10 MG PO TABS
10.0000 mg | ORAL_TABLET | Freq: Every day | ORAL | 0 refills | Status: AC
Start: 2022-06-13 — End: 2022-06-23

## 2022-06-13 MED ORDER — DICLOFENAC SODIUM 1 % EX GEL: 4.0000 g | Freq: Four times a day (QID) | CUTANEOUS | 2 refills | Status: DC

## 2022-06-13 NOTE — ED Provider Notes (Addendum)
[facility]    CSN: [id number] Arrival date & time: 06/13/22  1625    HISTORY   Chief Complaint  Patient presents with   Shoulder Pain   HPI Oscar Morris is a pleasant, 37 y.o. male who presents to urgent care today. The pain is present in the right shoulder. This is a chronic problem. The current episode started more than 3 months ago. There has been no history of acute injury to his right shoulder. The problem occurs constantly. The problem has been gradually worsening. The quality of the pain is described as aching. The pain is moderate. Associated symptoms include a limited range of motion. Pertinent negatives include no joint locking, joint swelling or numbness. The symptoms are aggravated by head movement at work.  Patient was seen at [facility] urgent care May 03, 2022 for the same issue.  Patient was provided with ibuprofen 600 mg and advised to follow-up with orthopedics.  No imaging was performed.  Patient states he has not been seen by orthopedics.  Patient states his pain continues to get worse.  Patient states he works in a [employer].  The history is provided by the patient.   Past Medical History:  Diagnosis Date   Asthma    Bronchitis    Retinal tear of right eye    Sleep apnea    There are no problems to display for this patient.  Past Surgical History:  Procedure Laterality Date   ADENOIDECTOMY      Home Medications    Prior to Admission medications   Medication Sig Start Date End Date Taking? Authorizing Provider  acetaminophen (TYLENOL) 500 MG tablet Take 2 tablets (1,000 mg total) by mouth every 8 (eight) hours. 06/13/22 07/13/22 Yes Lynden Oxford Scales, PA-C  baclofen (LIORESAL) 10 MG tablet Take 1 tablet (10 mg total) by mouth at bedtime for 10 days. 06/13/22 06/23/22 Yes Lynden Oxford Scales, PA-C  diclofenac Sodium (VOLTAREN) 1 % GEL Apply 4 g topically 4 (four) times daily. Apply to affected areas 4 times daily as needed for pain.  06/13/22  Yes Lynden Oxford Scales, PA-C  meloxicam (MOBIC) 15 MG tablet Take 1 tablet (15 mg total) by mouth daily. 06/13/22 07/13/22 Yes Lynden Oxford Scales, PA-C  multivitamin (ONE-A-DAY MEN'S) TABS tablet Take 1 tablet by mouth daily.    [provider]  mupirocin ointment (BACTROBAN) 2 % Apply 1 application topically daily. 07/13/21   Raspet, Derry Skill, PA-C    Family History History reviewed. No pertinent family history. Social History Social History   Tobacco Use   Smoking status: Never   Smokeless tobacco: Never  Substance Use Topics   Alcohol use: Yes   Drug use: No   Allergies   Dust mite extract, Grass extracts [gramineae pollens], and Mold extract [trichophyton]  Review of Systems Review of Systems Pertinent findings revealed after performing a 14 point review of systems has been noted in the history of present illness.  Physical Exam Triage Vital Signs ED Triage Vitals  Enc Vitals Group     BP 04/02/21 0827 (!) 147/82     Pulse Rate 04/02/21 0827 72     Resp 04/02/21 0827 18     Temp 04/02/21 0827 98.3 F (36.8 C)     Temp Source 04/02/21 0827 Oral     SpO2 04/02/21 0827 98 %     Weight --      Height --      Head Circumference --  Peak Flow --      Pain Score 04/02/21 0826 5     Pain Loc --      Pain Edu? --      Excl. in [place]? --    Updated Vital Signs BP (!) 152/98 (BP Location: Right Arm)   Pulse 86   Temp 99.3 F (37.4 C) (Oral)   Resp 18   SpO2 97%   Physical Exam Vitals and nursing note reviewed.  Constitutional:      General: He is not in acute distress.    Appearance: Normal appearance. He is normal weight. He is not ill-appearing.  HENT:     Head: Normocephalic and atraumatic.  Eyes:     Extraocular Movements: Extraocular movements intact.     Conjunctiva/sclera: Conjunctivae normal.     Pupils: Pupils are equal, round, and reactive to light.  Cardiovascular:     Rate and Rhythm: Normal rate and regular rhythm.  Pulmonary:      Effort: Pulmonary effort is normal.     Breath sounds: Normal breath sounds.  Musculoskeletal:     Right shoulder: Tenderness, bony tenderness and crepitus present. No swelling, deformity, effusion or laceration. Decreased range of motion. Decreased strength. Normal pulse.     Cervical back: Normal range of motion and neck supple.  Skin:    General: Skin is warm and dry.  Neurological:     General: No focal deficit present.     Mental Status: He is alert and oriented to person, place, and time. Mental status is at baseline.  Psychiatric:        Mood and Affect: Mood normal.        Behavior: Behavior normal.        Thought Content: Thought content normal.        Judgment: Judgment normal.     UC Couse / Diagnostics / Procedures:     Radiology DG Shoulder Right  Result Date: 06/13/2022 CLINICAL DATA:  Right shoulder pain. EXAM: RIGHT SHOULDER - 2+ VIEW COMPARISON:  None Available. FINDINGS: There is no acute fracture or dislocation. Bony alignment is normal. The joint spaces are preserved. The soft tissues are unremarkable. There is no erosive change. IMPRESSION: No acute fracture or dislocation. Electronically Signed   By: Valetta Mole M.D.   On: 06/13/2022 18:39    Procedures Procedures (including critical care time) EKG  Pending results:  Labs Reviewed - No data to display  Medications Ordered in UC: Medications  ketorolac (TORADOL) 30 MG/ML injection 30 mg (has no administration in time range)  acetaminophen (TYLENOL) tablet 975 mg (has no administration in time range)    UC Diagnoses / Final Clinical Impressions(s)   I have reviewed the triage vital signs and the nursing notes.  Pertinent labs & imaging results that were available during my care of the patient were reviewed by me and considered in my medical decision making (see chart for details).    Final diagnoses:  Strain of right shoulder, subsequent encounter    Patient was provided with an injection of  ketorolac during their visit today for acute pain relief. Patient was advised to: Begin meloxicam daily. Take baclofen once daily 1 hour prior to bedtime Begin acetaminophen 1000 mg 3 times daily on a scheduled basis. Apply topical Voltaren gel 4 times daily as needed Consider physical therapy, chiropractic care, orthopedic follow-up Patient education handout provided for self-care at home. Return precautions advised  Please see discharge instructions below for details of plan of  care as provided to patient. ED Prescriptions     Medication Sig Dispense Auth. Provider   meloxicam (MOBIC) 15 MG tablet Take 1 tablet (15 mg total) by mouth daily. 30 tablet Theadora Rama Scales, PA-C   diclofenac Sodium (VOLTAREN) 1 % GEL Apply 4 g topically 4 (four) times daily. Apply to affected areas 4 times daily as needed for pain. 100 g Theadora Rama Scales, PA-C   acetaminophen (TYLENOL) 500 MG tablet Take 2 tablets (1,000 mg total) by mouth every 8 (eight) hours. 180 tablet Theadora Rama Scales, PA-C   baclofen (LIORESAL) 10 MG tablet Take 1 tablet (10 mg total) by mouth at bedtime for 10 days. 10 tablet Theadora Rama Scales, PA-C      PDMP not reviewed this encounter.  Discharge Instructions:   Discharge Instructions      The mainstay of therapy for musculoskeletal pain is reduction of inflammation and relaxation of tension which is causing inflammation.  Keep in mind, pain always begets more pain.  To help you stay ahead of your pain and inflammation, I have provided the following regimen for you:   During your visit today, you received an injection of ketorolac, high-dose nonsteroidal anti-inflammatory pain medication that should significantly reduce your pain for the next 6 to 8 hours.    You were also provided with 975 mg of Tylenol during your visit today.  Please continue taking Tylenol 1000 mg every 8 hours.   This evening, please begin taking baclofen 10 mg.  This is a highly  effective muscle relaxer and antispasmodic which should continue to provide you with relaxation of your tense muscles, allow you to sleep well and to keep your pain under control.   Tomorrow morning, please begin taking meloxicam 15 mg once daily.  This is a nonsteroidal anti-inflammatory pain medication that works similarly to ibuprofen and naproxen.  Please do not take either of these medications while taking meloxicam.     During the day, please set aside time to apply ice to the affected area 4 times daily for 20 minutes each application.  This can be achieved by using a bag of frozen peas or corn, a Ziploc bag filled with ice and water, or Ziploc bag filled with half rubbing alcohol and half Dawn dish detergent, frozen into a slush.  Please be careful not to apply ice directly to your skin, always place a soft cloth between you and the ice pack.  There is also a company called [employer] that makes reusable gel ice packs.  You can order these online.   You are welcome to use topical anti-inflammatory creams such as Voltaren gel, capsaicin or Aspercreme as recommended.  These medications are available over-the-counter, please follow manufactures instructions for use.  As a courtesy, I provided you with a prescription for diclofenac in the event that your insurance will pay for this.   I recommend that you follow-up with [facility] urgent care walk-in clinic, provided you with her flyer.  Please also consider discussing referral to physical therapy with your primary care provider.  Physical therapist are very good at teasing out the underlying cause of acute lower back pain and helping with prevention of future recurrences.   I recommend that you remain out of work for the next several days, I provided you with a note to return to work in 3 days.  If you feel that you need this time extended, please follow-up with your primary care provider or return to urgent care  for reevaluation so  that we can provide you with a note for another 3 days.  An orthopedic specialist can possibly provide you with a note requesting accommodations.  Please also consider reaching out to your Holiday representative about applying for FMLA and/or short or long term disability.   Thank you for visiting urgent care today.  We appreciate the opportunity to participate in your care.     Disposition Upon Discharge:  Condition: stable for discharge home Home: take medications as prescribed; routine discharge instructions as discussed; follow up as advised.  Patient presented with an acute illness with associated systemic symptoms and significant discomfort requiring urgent management. In my opinion, this is a condition that a prudent lay person (someone who possesses an average knowledge of health and medicine) may potentially expect to result in complications if not addressed urgently such as respiratory distress, impairment of bodily function or dysfunction of bodily organs.   Routine symptom specific, illness specific and/or disease specific instructions were discussed with the patient and/or caregiver at length.   As such, the patient has been evaluated and assessed, work-up was performed and treatment was provided in alignment with urgent care protocols and evidence based medicine.  Patient/parent/caregiver has been advised that the patient may require follow up for further testing and treatment if the symptoms continue in spite of treatment, as clinically indicated and appropriate.  Patient/parent/caregiver has been advised to report to orthopedic urgent care clinic or return to the [facility] or PCP in 3-5 days if no better; follow-up with orthopedics, PCP or the Emergency Department if new signs and symptoms develop or if the current signs or symptoms continue to change or worsen for further workup, evaluation and treatment as clinically indicated and appropriate  The patient will follow up with their  current PCP if and as advised. If the patient does not currently have a PCP we will have assisted them in obtaining one.   The patient may need specialty follow up if the symptoms continue, in spite of conservative treatment and management, for further workup, evaluation, consultation and treatment as clinically indicated and appropriate.  Patient/parent/caregiver verbalized understanding and agreement of plan as discussed.  All questions were addressed during visit.  Please see discharge instructions below for further details of plan.  This office note has been dictated using Museum/gallery curator.  Unfortunately, this method of dictation can sometimes lead to typographical or grammatical errors.  I apologize for your inconvenience in advance if this occurs.  Please do not hesitate to reach out to me if clarification is needed.      Lynden Oxford Scales, PA-C 06/13/22 1850    Lynden Oxford Scales, PA-C 06/13/22 1851

## 2022-06-13 NOTE — Discharge Instructions (Addendum)
The mainstay of therapy for musculoskeletal pain is reduction of inflammation and relaxation of tension which is causing inflammation.  Keep in mind, pain always begets more pain.  To help you stay ahead of your pain and inflammation, I have provided the following regimen for you:   During your visit today, you received an injection of ketorolac, high-dose nonsteroidal anti-inflammatory pain medication that should significantly reduce your pain for the next 6 to 8 hours.    You were also provided with 975 mg of Tylenol during your visit today.  Please continue taking Tylenol 1000 mg every 8 hours.   This evening, please begin taking baclofen 10 mg.  This is a highly effective muscle relaxer and antispasmodic which should continue to provide you with relaxation of your tense muscles, allow you to sleep well and to keep your pain under control.   Tomorrow morning, please begin taking meloxicam 15 mg once daily.  This is a nonsteroidal anti-inflammatory pain medication that works similarly to ibuprofen and naproxen.  Please do not take either of these medications while taking meloxicam.     During the day, please set aside time to apply ice to the affected area 4 times daily for 20 minutes each application.  This can be achieved by using a bag of frozen peas or corn, a Ziploc bag filled with ice and water, or Ziploc bag filled with half rubbing alcohol and half Dawn dish detergent, frozen into a slush.  Please be careful not to apply ice directly to your skin, always place a soft cloth between you and the ice pack.  There is also a company called [employer] that makes reusable gel ice packs.  You can order these online.   You are welcome to use topical anti-inflammatory creams such as Voltaren gel, capsaicin or Aspercreme as recommended.  These medications are available over-the-counter, please follow manufactures instructions for use.  As a courtesy, I provided you with a prescription for  diclofenac in the event that your insurance will pay for this.   I recommend that you follow-up with [facility] urgent care walk-in clinic, provided you with her flyer.  Please also consider discussing referral to physical therapy with your primary care provider.  Physical therapist are very good at teasing out the underlying cause of acute lower back pain and helping with prevention of future recurrences.   I recommend that you remain out of work for the next several days, I provided you with a note to return to work in 3 days.  If you feel that you need this time extended, please follow-up with your primary care provider or return to urgent care for reevaluation so that we can provide you with a note for another 3 days.  An orthopedic specialist can possibly provide you with a note requesting accommodations.  Please also consider reaching out to your Holiday representative about applying for FMLA and/or short or long term disability.   Thank you for visiting urgent care today.  We appreciate the opportunity to participate in your care.

## 2022-06-13 NOTE — ED Triage Notes (Signed)
Pt c/o right shoulder pain that is getting worse.   Started: several weeks ago   Home interventions: motrin

## 2022-06-20 ENCOUNTER — Encounter (HOSPITAL_COMMUNITY): Payer: Self-pay

## 2022-06-20 ENCOUNTER — Ambulatory Visit (HOSPITAL_COMMUNITY)
Admission: EM | Admit: 2022-06-20 | Discharge: 2022-06-20 | Disposition: A | Discharge status: Home / Self Care | Payer: Managed Care, Other (non HMO) | Attending: Physician Assistant | Admitting: Physician Assistant

## 2022-06-20 DIAGNOSIS — M25511 Pain in right shoulder: Secondary | ICD-10-CM

## 2022-06-20 MED ORDER — DICLOFENAC SODIUM 75 MG PO TBEC: 75.0000 mg | DELAYED_RELEASE_TABLET | Freq: Two times a day (BID) | ORAL | 0 refills | Status: AC

## 2022-06-20 NOTE — Discharge Instructions (Signed)
Follow up with Orthopaedist as scheduled

## 2022-06-20 NOTE — ED Provider Notes (Signed)
[place]    CSN: [id number] Arrival date & time: 06/20/22  1612      History   Chief Complaint Chief Complaint  Patient presents with   Shoulder Pain    HPI Oscar Morris is a 37 y.o. male.   Pt complains of shoulder pain.  Pt reports he is scheduled to be seen at [facility] in 2 days.  Pt here for recheck.  Pt does not feel like he can go to work. Pt had an xray on 1/8 at [facility] urgent care.  Pt reports persistant pain   The history is provided by the patient. No language interpreter was used.  Shoulder Pain Location:  Shoulder Shoulder location:  R shoulder Injury: no   Pain details:    Quality:  Aching   Radiates to:  Does not radiate   Severity:  Moderate   Onset quality:  Gradual   Past Medical History:  Diagnosis Date   Asthma    Bronchitis    Retinal tear of right eye    Sleep apnea     There are no problems to display for this patient.   Past Surgical History:  Procedure Laterality Date   ADENOIDECTOMY         Home Medications    Prior to Admission medications   Medication Sig Start Date End Date Taking? Authorizing Provider  acetaminophen (TYLENOL) 500 MG tablet Take 2 tablets (1,000 mg total) by mouth every 8 (eight) hours. 06/13/22 07/13/22 Yes Lynden Oxford Scales, PA-C  meloxicam (MOBIC) 15 MG tablet Take 1 tablet (15 mg total) by mouth daily. 06/13/22 07/13/22 Yes Lynden Oxford Scales, PA-C  baclofen (LIORESAL) 10 MG tablet Take 1 tablet (10 mg total) by mouth at bedtime for 10 days. 06/13/22 06/23/22  Lynden Oxford Scales, PA-C  multivitamin (ONE-A-DAY MEN'S) TABS tablet Take 1 tablet by mouth daily.    [provider]  mupirocin ointment (BACTROBAN) 2 % Apply 1 application topically daily. 07/13/21   Raspet, Derry Skill, PA-C    Family History History reviewed. No pertinent family history.  Social History Social History   Tobacco Use   Smoking status: Never   Smokeless tobacco: Never  Substance Use Topics    Alcohol use: Yes   Drug use: No     Allergies   Dust mite extract, Grass extracts [gramineae pollens], and Mold extract [trichophyton]   Review of Systems Review of Systems  All other systems reviewed and are negative.    Physical Exam Triage Vital Signs ED Triage Vitals  Enc Vitals Group     BP 06/20/22 1817 114/77     Pulse Rate 06/20/22 1817 62     Resp 06/20/22 1817 16     Temp 06/20/22 1817 98 F (36.7 C)     Temp Source 06/20/22 1817 Oral     SpO2 06/20/22 1817 96 %     Weight --      Height --      Head Circumference --      Peak Flow --      Pain Score 06/20/22 1816 10     Pain Loc --      Pain Edu? --      Excl. in [place]? --    No data found.  Updated Vital Signs BP 114/77 (BP Location: Left Arm)   Pulse 62   Temp 98 F (36.7 C) (Oral)   Resp 16   SpO2 96%   Visual Acuity Right Eye Distance:  Left Eye Distance:   Bilateral Distance:    Right Eye Near:   Left Eye Near:    Bilateral Near:     Physical Exam Vitals and nursing note reviewed.  Constitutional:      Appearance: He is well-developed.  HENT:     Head: Normocephalic.  Pulmonary:     Effort: Pulmonary effort is normal.  Abdominal:     General: There is no distension.  Musculoskeletal:        General: Tenderness present. No swelling or deformity.     Cervical back: Normal range of motion.  Skin:    General: Skin is warm.  Neurological:     General: No focal deficit present.     Mental Status: He is alert and oriented to person, place, and time.      UC Treatments / Results  Labs (all labs ordered are listed, but only abnormal results are displayed) Labs Reviewed - No data to display  EKG   Radiology No results found.  Procedures Procedures (including critical care time)  Medications Ordered in UC Medications - No data to display  Initial Impression / Assessment and Plan / UC Course  I have reviewed the triage vital signs and the nursing notes.  Pertinent labs  & imaging results that were available during my care of the patient were reviewed by me and considered in my medical decision making (see chart for details).     Xray reviewed from 1/8.  Pt has normal xray.  Pt given out of work note.  Pt advised to follow up with Orthopaedist as scheduled  Final Clinical Impressions(s) / UC Diagnoses   Final diagnoses:  Acute pain of right shoulder   Discharge Instructions   None    ED Prescriptions   None    PDMP not reviewed this encounter. An After Visit Summary was printed and given to the patient.    Oscar Morris, [place] 06/20/22 [id number]

## 2022-06-20 NOTE — ED Triage Notes (Signed)
Pt is here for right shoulder pain radiates to back of neck and shoulder blade x 2wks

## 2022-06-22 ENCOUNTER — Ambulatory Visit (INDEPENDENT_AMBULATORY_CARE_PROVIDER_SITE_OTHER): Payer: Managed Care, Other (non HMO)

## 2022-06-22 ENCOUNTER — Encounter: Payer: Self-pay | Admitting: Surgical

## 2022-06-22 ENCOUNTER — Ambulatory Visit (INDEPENDENT_AMBULATORY_CARE_PROVIDER_SITE_OTHER): Payer: Managed Care, Other (non HMO) | Admitting: Surgical

## 2022-06-22 DIAGNOSIS — M542 Cervicalgia: Secondary | ICD-10-CM

## 2022-06-22 DIAGNOSIS — M25311 Other instability, right shoulder: Secondary | ICD-10-CM | POA: Diagnosis not present

## 2022-06-22 MED ORDER — DIAZEPAM 5 MG PO TABS: ORAL_TABLET | ORAL | 0 refills | Status: AC

## 2022-06-22 NOTE — Progress Notes (Signed)
Office Visit Note   Patient: Oscar Morris           Date of Birth: 12-27-1985           MRN: [id number] Visit Date: 06/22/2022 Requested by: No referring provider defined for this encounter. PCP: Patient, No Pcp Per  Subjective: Chief Complaint  Patient presents with   Right Shoulder - Pain    HPI: Oscar Morris is a 37 y.o. male who presents to the office reporting right shoulder pain.  Patient states that he fell directly onto his right shoulder last March in 2023.  This happened after slipping on a wet [place].  He felt like his shoulder dislocated and has had consistent shoulder pain and apprehension ever since.  No subsequent dislocation events but he does feel like the shoulder subluxes.  He has pain that he mostly localizes to the superior and anterior aspects of the right shoulder with some radiation down to the bicep region and radiation up to the neck and into the scapular region.  He has constant pain that interferes with his work.  He has had to call out of work several times.  No history of prior surgery or dislocation before this event.  He is right-hand dominant.  Cannot sleep at night due to the amount of pain he is in.  Does note consistent clicking and popping throughout the day.  No numbness or tingling or burning sensation.  Taking Voltaren without much relief.  Went to the urgent care..                ROS: All systems reviewed are negative as they relate to the chief complaint within the history of present illness.  Patient denies fevers or chills.  Assessment & Plan: Visit Diagnoses:  1. Shoulder instability, right   2. Neck pain     Plan: Patient is a 37 year old male who presents for evaluation of right shoulder pain.  He had what sounds like dislocation event in March 2023 after falling directly on his shoulder.  Has not had any subsequent events but does feel like the shoulder subluxes and feels unstable and apprehensive with using his shoulder.  He has  constant pain in the shoulder that radiates down into the bicep and up to the neck.  Has radiographs of the right shoulder that does demonstrate some flattening of the humeral head on 1 view that may represent Hill-Sachs deformity.  He works at [employer].  This is his dominant arm.  Plan to further evaluate right shoulder instability with MRI arthrogram of the right shoulder.  Prescribe Valium to help with sedation during MRI scan.  Follow-up in 3 weeks after MRI to review results.  Follow-Up Instructions: No follow-ups on file.   Orders:  Orders Placed This Encounter  Procedures   XR Cervical Spine 2 or 3 views   MR SHOULDER RIGHT W CONTRAST   Arthrogram   No orders of the defined types were placed in this encounter.     Procedures: No procedures performed   Clinical Data: No additional findings.  Objective: Vital Signs: There were no vitals taken for this visit.  Physical Exam:  Constitutional: Patient appears well-developed HEENT:  Head: Normocephalic Eyes:EOM are normal Neck: Normal range of motion Cardiovascular: Normal rate Pulmonary/chest: Effort normal Neurologic: Patient is alert Skin: Skin is warm Psychiatric: Patient has normal mood and affect  Ortho Exam: Ortho exam demonstrates right shoulder with 60 degrees X rotation, 120 degrees abduction,  160 degrees forward flexion compared with left shoulder which has 60 degrees X rotation, 120 degrees abduction, 180 degrees forward elevation.  Positive apprehension in the right shoulder.  Tenderness over the bicep tendon in the bicipital groove.  Minimal tenderness over the [facility] joint.  Excellent strength with intact EPL, FPL, grip strength testing, pronation/supination, finger abduction, bicep, tricep.  Axillary nerve intact with deltoid firing.  Excellent rotator cuff strength of supra, infra, subscap rated 5/5.  No tenderness throughout the axial cervical spine.  Negative Spurling sign.  Negative  Lhermitte sign.  Does have stiffness with leaning his neck to the right side and with rotation to the right side.  Specialty Comments:  No specialty comments available.  Imaging: No results found.   PMFS History: There are no problems to display for this patient.  Past Medical History:  Diagnosis Date   Asthma    Bronchitis    Retinal tear of right eye    Sleep apnea     No family history on file.  Past Surgical History:  Procedure Laterality Date   ADENOIDECTOMY     Social History   Occupational History   Not on file  Tobacco Use   Smoking status: Never   Smokeless tobacco: Never  Substance and Sexual Activity   Alcohol use: Yes   Drug use: No   Sexual activity: Not on file

## 2022-07-07 ENCOUNTER — Telehealth: Payer: Self-pay | Admitting: Surgical

## 2022-07-07 NOTE — Telephone Encounter (Signed)
[employer] forms received. To [employer]. 

## 2022-07-13 ENCOUNTER — Inpatient Hospital Stay: Admission: RE | Admit: 2022-07-13 | Payer: Managed Care, Other (non HMO) | Source: Ambulatory Visit

## 2022-07-13 ENCOUNTER — Other Ambulatory Visit: Payer: Managed Care, Other (non HMO)

## 2022-07-14 ENCOUNTER — Telehealth: Payer: Self-pay | Admitting: Orthopedic Surgery

## 2022-07-14 NOTE — Telephone Encounter (Signed)
Patient stated he was contacted and told his insurance denied his MRI and also  the meds.Please contact patient [phone]

## 2022-07-15 ENCOUNTER — Ambulatory Visit: Payer: Managed Care, Other (non HMO) | Admitting: Orthopedic Surgery

## 2022-07-19 NOTE — Telephone Encounter (Signed)
I can do peer to peer

## 2022-08-03 ENCOUNTER — Telehealth: Payer: Self-pay | Admitting: Orthopedic Surgery

## 2022-08-03 ENCOUNTER — Other Ambulatory Visit: Payer: Managed Care, Other (non HMO)

## 2022-08-03 ENCOUNTER — Inpatient Hospital Stay: Admission: RE | Admit: 2022-08-03 | Payer: Managed Care, Other (non HMO) | Source: Ambulatory Visit

## 2022-08-03 NOTE — Telephone Encounter (Signed)
Patient was advised to why MRI was denied and was advised we will work on [employer] so he may get approved

## 2022-08-04 NOTE — Telephone Encounter (Signed)
Called pt and left vm stating this has been approved he can call img to reschedule

## 2022-08-04 NOTE — Telephone Encounter (Signed)
Pt has been approved with auth # [id number], valid 06/23/22-12/20/22

## 2022-08-11 ENCOUNTER — Telehealth: Payer: Self-pay | Admitting: Orthopedic Surgery

## 2022-08-11 NOTE — Telephone Encounter (Signed)
Fyi I tried calling pt. Mailbox was full

## 2022-08-11 NOTE — Telephone Encounter (Signed)
Needs to schedule MRI scan and then come back so we can discuss findings and then discuss work status

## 2022-08-11 NOTE — Telephone Encounter (Signed)
Can you advise on status of MRI that was ordered in [place] please

## 2022-08-11 NOTE — Telephone Encounter (Signed)
Pt can call to schedule appt, he has been approved.

## 2022-08-11 NOTE — Telephone Encounter (Signed)
[employer] forms received. Patient last seen 06/22/22. Please advise work status and provide note. Thank you.

## 2022-08-12 NOTE — Telephone Encounter (Signed)
Unable to complete forms until patient is seen follow up for MRI per [facility]. See below.

## 2022-08-12 NOTE — Telephone Encounter (Signed)
Tried calling again but mailbox was full
